# Patient Record
Sex: Male | Born: 1960 | Race: White | Hispanic: No | Marital: Married | Smoking: Never smoker
Health system: Southern US, Community
[De-identification: ages and names within clinical notes are randomized; demographics above are authoritative.]

## PROBLEM LIST (undated history)

## (undated) DIAGNOSIS — E785 Hyperlipidemia, unspecified: Secondary | ICD-10-CM

## (undated) DIAGNOSIS — I10 Essential (primary) hypertension: Secondary | ICD-10-CM

## (undated) HISTORY — DX: Hyperlipidemia, unspecified: E78.5

## (undated) HISTORY — DX: Essential (primary) hypertension: I10

## (undated) HISTORY — PX: OTHER SURGICAL HISTORY: SHX169

---

## 2012-10-12 ENCOUNTER — Ambulatory Visit: Payer: Self-pay

## 2012-10-12 ENCOUNTER — Other Ambulatory Visit: Payer: Self-pay | Admitting: Occupational Medicine

## 2012-10-12 DIAGNOSIS — Z0000 Encounter for general adult medical examination without abnormal findings: Secondary | ICD-10-CM

## 2015-06-27 ENCOUNTER — Ambulatory Visit: Payer: Self-pay

## 2015-06-27 ENCOUNTER — Other Ambulatory Visit: Payer: Self-pay | Admitting: Occupational Medicine

## 2015-06-27 DIAGNOSIS — Z0000 Encounter for general adult medical examination without abnormal findings: Secondary | ICD-10-CM

## 2016-04-16 ENCOUNTER — Ambulatory Visit (INDEPENDENT_AMBULATORY_CARE_PROVIDER_SITE_OTHER): Payer: PRIVATE HEALTH INSURANCE

## 2016-04-16 ENCOUNTER — Encounter: Payer: Self-pay | Admitting: Nurse Practitioner

## 2016-04-16 ENCOUNTER — Ambulatory Visit (INDEPENDENT_AMBULATORY_CARE_PROVIDER_SITE_OTHER): Payer: PRIVATE HEALTH INSURANCE | Admitting: Nurse Practitioner

## 2016-04-16 VITALS — BP 116/74 | HR 73 | Temp 96.9°F | Ht 72.0 in | Wt 292.0 lb

## 2016-04-16 DIAGNOSIS — I10 Essential (primary) hypertension: Secondary | ICD-10-CM

## 2016-04-16 DIAGNOSIS — Z0000 Encounter for general adult medical examination without abnormal findings: Secondary | ICD-10-CM

## 2016-04-16 DIAGNOSIS — Z125 Encounter for screening for malignant neoplasm of prostate: Secondary | ICD-10-CM

## 2016-04-16 DIAGNOSIS — E785 Hyperlipidemia, unspecified: Secondary | ICD-10-CM | POA: Diagnosis not present

## 2016-04-16 DIAGNOSIS — L0293 Carbuncle, unspecified: Secondary | ICD-10-CM

## 2016-04-16 DIAGNOSIS — F329 Major depressive disorder, single episode, unspecified: Secondary | ICD-10-CM

## 2016-04-16 DIAGNOSIS — F32A Depression, unspecified: Secondary | ICD-10-CM

## 2016-04-16 LAB — URINALYSIS
BILIRUBIN UA: NEGATIVE
GLUCOSE, UA: NEGATIVE
KETONES UA: NEGATIVE
Leukocytes, UA: NEGATIVE
Nitrite, UA: NEGATIVE
Protein, UA: NEGATIVE
RBC, UA: NEGATIVE
SPEC GRAV UA: 1.015 (ref 1.005–1.030)
UUROB: 0.2 mg/dL (ref 0.2–1.0)
pH, UA: 7 (ref 5.0–7.5)

## 2016-04-16 MED ORDER — ATORVASTATIN CALCIUM 80 MG PO TABS: 80.0000 mg | ORAL_TABLET | Freq: Every day | ORAL | Status: DC

## 2016-04-16 MED ORDER — NALTREXONE-BUPROPION HCL ER 8-90 MG PO TB12: 2.0000 | ORAL_TABLET | Freq: Two times a day (BID) | ORAL | Status: DC

## 2016-04-16 MED ORDER — SULFAMETHOXAZOLE-TRIMETHOPRIM 800-160 MG PO TABS: 1.0000 | ORAL_TABLET | Freq: Two times a day (BID) | ORAL | Status: DC

## 2016-04-16 MED ORDER — AMLODIPINE BESY-BENAZEPRIL HCL 5-20 MG PO CAPS: 1.0000 | ORAL_CAPSULE | Freq: Two times a day (BID) | ORAL | Status: DC

## 2016-04-16 MED ORDER — BUPROPION HCL ER (XL) 300 MG PO TB24: 300.0000 mg | ORAL_TABLET | Freq: Every day | ORAL | Status: DC

## 2016-04-16 NOTE — Progress Notes (Signed)
Subjective:    Patient ID: Antonio Weber, male    DOB: 1961/08/09, 55 y.o.   MRN: [id number]   Patient here today to establish care and for  follow up of chronic medical problems. He has no complaints today.   Outpatient Encounter Prescriptions as of 04/16/2016  Medication Sig  . amLODipine-benazepril (LOTREL) 5-20 MG capsule Take 1 capsule by mouth 2 (two) times daily.  Marland Kitchen atorvastatin (LIPITOR) 80 MG tablet   . buPROPion (WELLBUTRIN XL) 300 MG 24 hr tablet    No facility-administered encounter medications on file as of 04/16/2016.     Hypertension This is a chronic problem. The current episode started more than 1 year ago. The problem is unchanged. The problem is controlled. Pertinent negatives include no blurred vision, chest pain, headaches, malaise/fatigue, palpitations or shortness of breath. There are no associated agents to hypertension. Risk factors for coronary artery disease include dyslipidemia, male gender and obesity. Past treatments include ACE inhibitors. The current treatment provides moderate improvement. Compliance problems include diet and exercise.  There is no history of CAD/MI or CVA.  Hyperlipidemia This is a chronic problem. The current episode started more than 1 year ago. The problem is controlled. Recent lipid tests were reviewed and are normal. Exacerbating diseases include obesity. He has no history of diabetes or hypothyroidism. Pertinent negatives include no chest pain or shortness of breath. Current antihyperlipidemic treatment includes statins. The current treatment provides moderate improvement of lipids. Compliance problems include adherence to diet and adherence to exercise.  Risk factors for coronary artery disease include dyslipidemia, hypertension, male sex and obesity.  depression Currently on wellbutrin which is working well to keep him calm and from worrying so much. No side effects. carbuncle frequent carbuncles and the only thing that works for [employer]  is wellbutrin.     Review of Systems  Constitutional: Negative.  Negative for malaise/fatigue.  HENT: Negative.   Eyes: Negative for blurred vision.  Respiratory: Negative for shortness of breath.   Cardiovascular: Negative for chest pain and palpitations.  Genitourinary: Negative.   Neurological: Negative for headaches.  Psychiatric/Behavioral: Negative.   All other systems reviewed and are negative.      Objective:   Physical Exam  Constitutional: He is oriented to person, place, and time. He appears well-developed and well-nourished.  HENT:  Head: Normocephalic.  Right Ear: External ear normal.  Left Ear: External ear normal.  Nose: Nose normal.  Mouth/Throat: Oropharynx is clear and moist.  Eyes: EOM are normal. Pupils are equal, round, and reactive to light.  Neck: Normal range of motion. Neck supple. No JVD present. No thyromegaly present.  Cardiovascular: Normal rate, regular rhythm, normal heart sounds and intact distal pulses.  Exam reveals no gallop and no friction rub.   No murmur heard. Pulmonary/Chest: Effort normal and breath sounds normal. No respiratory distress. He has no wheezes. He has no rales. He exhibits no tenderness.  Abdominal: Soft. Bowel sounds are normal. He exhibits no mass. There is no tenderness.  Genitourinary: Prostate normal and penis normal.  Musculoskeletal: Normal range of motion. He exhibits no edema.  Lymphadenopathy:    He has no cervical adenopathy.  Neurological: He is alert and oriented to person, place, and time. No cranial nerve deficit.  Skin: Skin is warm and dry.  Psychiatric: He has a normal mood and affect. His behavior is normal. Judgment and thought content normal.   BP 116/74 mmHg  Pulse 73  Temp(Src) 96.9 F (36.1 C) (Oral)  Ht 6' (  1.829 m)  Wt 292 lb (132.45 kg)  BMI 39.59 kg/m2 Spirometry- normal EKG- NSR Chest xray- no cardiopulmonary disease.-Preliminary reading by Ronnald Collum, FNP  [facility]       Assessment  & Plan:  1. Essential hypertension Do not add salt to diet - CMP14+EGFR - DG Chest 2 View; Future - EKG 12-Lead  2. Hyperlipidemia Low fta diet - Lipid panel  3. Depression Stress management  4. Morbid obesity, unspecified obesity type ([place]) Discussed diet and exercise for person with BMI >25 Will recheck weight in 3-6 months  5. Prostate cancer screening - PSA, total and free  6. Annual physical exam - CBC with Differential/Platelet - PR BREATHING CAPACITY TEST - Urinalysis  7. Frequent carbuncles -bactrim 1 po BID X10 days prn Do not pick or scratch at areas.  Labs pending Health maintenance reviewed Diet and exercise encouraged Continue all meds Follow up  In 6 months   [place], FNP

## 2016-04-16 NOTE — Patient Instructions (Signed)

## 2016-04-19 ENCOUNTER — Other Ambulatory Visit: Payer: PRIVATE HEALTH INSURANCE

## 2016-04-20 LAB — CBC WITH DIFFERENTIAL/PLATELET
BASOS ABS: 0 10*3/uL (ref 0.0–0.2)
Basos: 0 %
EOS (ABSOLUTE): 0.2 10*3/uL (ref 0.0–0.4)
Eos: 3 %
HEMOGLOBIN: 15 g/dL (ref 12.6–17.7)
Hematocrit: 46 % (ref 37.5–51.0)
Immature Grans (Abs): 0 10*3/uL (ref 0.0–0.1)
Immature Granulocytes: 0 %
LYMPHS ABS: 2.4 10*3/uL (ref 0.7–3.1)
Lymphs: 36 %
MCH: 28.5 pg (ref 26.6–33.0)
MCHC: 32.6 g/dL (ref 31.5–35.7)
MCV: 88 fL (ref 79–97)
MONOCYTES: 10 %
MONOS ABS: 0.6 10*3/uL (ref 0.1–0.9)
Neutrophils Absolute: 3.3 10*3/uL (ref 1.4–7.0)
Neutrophils: 51 %
PLATELETS: 313 10*3/uL (ref 150–379)
RBC: 5.26 x10E6/uL (ref 4.14–5.80)
RDW: 13.8 % (ref 12.3–15.4)
WBC: 6.6 10*3/uL (ref 3.4–10.8)

## 2016-04-20 LAB — LIPID PANEL
CHOLESTEROL TOTAL: 109 mg/dL (ref 100–199)
Chol/HDL Ratio: 3.6 ratio units (ref 0.0–5.0)
HDL: 30 mg/dL — AB (ref >39)
LDL CALC: 50 mg/dL (ref 0–99)
TRIGLYCERIDES: 144 mg/dL (ref 0–149)
VLDL CHOLESTEROL CAL: 29 mg/dL (ref 5–40)

## 2016-04-20 LAB — CMP14+EGFR
ALBUMIN: 3.9 g/dL (ref 3.5–5.5)
ALK PHOS: 74 IU/L (ref 39–117)
ALT: 30 IU/L (ref 0–44)
AST: 23 IU/L (ref 0–40)
Albumin/Globulin Ratio: 1.3 (ref 1.2–2.2)
BILIRUBIN TOTAL: 0.6 mg/dL (ref 0.0–1.2)
BUN / CREAT RATIO: 13 (ref 9–20)
BUN: 14 mg/dL (ref 6–24)
CHLORIDE: 105 mmol/L (ref 96–106)
CO2: 21 mmol/L (ref 18–29)
CREATININE: 1.05 mg/dL (ref 0.76–1.27)
Calcium: 9 mg/dL (ref 8.7–10.2)
GFR calc Af Amer: 92 mL/min/{1.73_m2} (ref >59)
GFR calc non Af Amer: 80 mL/min/{1.73_m2} (ref >59)
GLUCOSE: 97 mg/dL (ref 65–99)
Globulin, Total: 2.9 g/dL (ref 1.5–4.5)
Potassium: 4.5 mmol/L (ref 3.5–5.2)
Sodium: 141 mmol/L (ref 134–144)
Total Protein: 6.8 g/dL (ref 6.0–8.5)

## 2016-04-20 LAB — PSA, TOTAL AND FREE
PSA FREE: 0.1 ng/mL
PSA, Free Pct: 25 %
Prostate Specific Ag, Serum: 0.4 ng/mL (ref 0.0–4.0)

## 2016-05-07 ENCOUNTER — Telehealth: Payer: Self-pay | Admitting: Nurse Practitioner

## 2016-05-10 NOTE — Telephone Encounter (Signed)
EKG and breathing function test faxed

## 2016-05-14 ENCOUNTER — Other Ambulatory Visit: Payer: Self-pay | Admitting: Nurse Practitioner

## 2016-07-02 ENCOUNTER — Other Ambulatory Visit: Payer: Self-pay | Admitting: Nurse Practitioner

## 2016-07-02 DIAGNOSIS — L0293 Carbuncle, unspecified: Secondary | ICD-10-CM

## 2016-07-02 MED ORDER — SULFAMETHOXAZOLE-TRIMETHOPRIM 800-160 MG PO TABS: 1.0000 | ORAL_TABLET | Freq: Two times a day (BID) | ORAL | 0 refills | Status: DC

## 2016-07-27 ENCOUNTER — Other Ambulatory Visit: Payer: Self-pay | Admitting: Nurse Practitioner

## 2016-08-14 ENCOUNTER — Other Ambulatory Visit: Payer: Self-pay | Admitting: Nurse Practitioner

## 2016-08-14 MED ORDER — CYCLOBENZAPRINE HCL 10 MG PO TABS: 10.0000 mg | ORAL_TABLET | Freq: Three times a day (TID) | ORAL | 1 refills | Status: DC | PRN

## 2016-09-02 ENCOUNTER — Encounter: Payer: Self-pay | Admitting: Nurse Practitioner

## 2016-09-02 ENCOUNTER — Ambulatory Visit (INDEPENDENT_AMBULATORY_CARE_PROVIDER_SITE_OTHER): Payer: PRIVATE HEALTH INSURANCE | Admitting: Nurse Practitioner

## 2016-09-02 VITALS — BP 125/79 | HR 81 | Temp 97.2°F | Ht 72.0 in | Wt 297.0 lb

## 2016-09-02 DIAGNOSIS — M545 Low back pain, unspecified: Secondary | ICD-10-CM

## 2016-09-02 MED ORDER — METHYLPREDNISOLONE ACETATE 80 MG/ML IJ SUSP
80.0000 mg | Freq: Once | INTRAMUSCULAR | Status: AC
  Administered 2016-09-02: 80 mg via INTRAMUSCULAR

## 2016-09-02 MED ORDER — PREDNISONE 10 MG (21) PO TBPK: ORAL_TABLET | ORAL | 0 refills | Status: DC

## 2016-09-02 NOTE — Patient Instructions (Signed)
Back Pain, Adult Introduction Back pain is very common. The pain often gets better over time. The cause of back pain is usually not dangerous. Most people can learn to manage their back pain on their own. Follow these instructions at home: Watch your back pain for any changes. The following actions may help to lessen any pain you are feeling:  Stay active. Start with short walks on flat ground if you can. Try to walk farther each day.  Exercise regularly as told by your doctor. Exercise helps your back heal faster. It also helps avoid future injury by keeping your muscles strong and flexible.  Do not sit, drive, or stand in one place for more than 30 minutes.  Do not stay in bed. Resting more than 1-2 days can slow down your recovery.  Be careful when you bend or lift an object. Use good form when lifting:  Bend at your knees.  Keep the object close to your body.  Do not twist.  Sleep on a firm mattress. Lie on your side, and bend your knees. If you lie on your back, put a pillow under your knees.  Take medicines only as told by your doctor.  Put ice on the injured area.  Put ice in a plastic bag.  Place a towel between your skin and the bag.  Leave the ice on for 20 minutes, 2-3 times a day for the first 2-3 days. After that, you can switch between ice and heat packs.  Avoid feeling anxious or stressed. Find good ways to deal with stress, such as exercise.  Maintain a healthy weight. Extra weight puts stress on your back. Contact a doctor if:  You have pain that does not go away with rest or medicine.  You have worsening pain that goes down into your legs or buttocks.  You have pain that does not get better in one week.  You have pain at night.  You lose weight.  You have a fever or chills. Get help right away if:  You cannot control when you poop (bowel movement) or pee (urinate).  Your arms or legs feel weak.  Your arms or legs lose feeling  (numbness).  You feel sick to your stomach (nauseous) or throw up (vomit).  You have belly (abdominal) pain.  You feel like you may pass out (faint). This information is not intended to replace advice given to you by your health care provider. Make sure you discuss any questions you have with your health care provider. Document Released: 03/04/2008 Document Revised: 02/22/2016 Document Reviewed: 01/18/2014  2017 Elsevier  

## 2016-09-02 NOTE — Addendum Note (Signed)
Addended by: Bennie PieriniMARTIN, MARY-MARGARET on: 09/02/2016 03:42 PM   Modules accepted: Orders

## 2016-09-02 NOTE — Progress Notes (Signed)
   Subjective:    Patient ID: Antonio Weber, male    DOB: 1960/10/20, 55 y.o.   MRN: [id number]  HPI Patient comes in today with his wife c/o back pain. Most of the time it is a dull ache but when he moves certain ways it is a stabbing ain. Pain is constant 4/10 and can go to a 10/10. Started about November 19. 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 He has had some muscle relaxer  But can not take them when he is working. Pain is constant across lower back but does not radiate to buttocks or down leg.    Review of Systems  Constitutional: Negative.   HENT: Negative.   Respiratory: Negative.   Cardiovascular: Negative.   Genitourinary: Negative.   Neurological: Negative.   Psychiatric/Behavioral: Negative.   All other systems reviewed and are negative.      Objective:   Physical Exam  Constitutional: He is oriented to person, place, and time. He appears well-developed and well-nourished. No distress.  Cardiovascular: Normal rate, regular rhythm and normal heart sounds.   Pulmonary/Chest: Effort normal and breath sounds normal.  Musculoskeletal:  FROM of lumbar spine with pain on flexion and extension. (-) SLR bil Motor strength and sensation distally intact  Neurological: He is alert and oriented to person, place, and time. He has normal reflexes.  Psychiatric: He has a normal mood and affect. His behavior is normal. Judgment and thought content normal.    BP 125/79   Pulse 81   Temp 97.2 F (36.2 C) (Oral)   Ht 6' (1.829 m)   Wt 297 lb (134.7 kg)   BMI 40.28 kg/m        Assessment & Plan:  1. Acute midline low back pain without sciatica Moist heat Rest No heavy lifting - predniSONE (STERAPRED UNI-PAK 21 TAB) 10 MG (21) TBPK tablet; As directed x 6 days  Dispense: 21 tablet; Refill: 0 - MR Lumbar Spine Wo Contrast;  Future  Mary-Margaret Daphine DeutscherMartin, FNP

## 2016-10-23 ENCOUNTER — Other Ambulatory Visit: Payer: Self-pay | Admitting: Nurse Practitioner

## 2016-10-23 DIAGNOSIS — F3342 Major depressive disorder, recurrent, in full remission: Secondary | ICD-10-CM

## 2016-10-23 DIAGNOSIS — E782 Mixed hyperlipidemia: Secondary | ICD-10-CM

## 2016-10-23 MED ORDER — BUPROPION HCL ER (XL) 300 MG PO TB24: 300.0000 mg | ORAL_TABLET | Freq: Every day | ORAL | 5 refills | Status: DC

## 2016-10-23 MED ORDER — ATORVASTATIN CALCIUM 80 MG PO TABS: 80.0000 mg | ORAL_TABLET | Freq: Every day | ORAL | 5 refills | Status: DC

## 2016-10-23 MED ORDER — NALTREXONE-BUPROPION HCL ER 8-90 MG PO TB12: ORAL_TABLET | ORAL | 1 refills | Status: DC

## 2016-10-25 ENCOUNTER — Other Ambulatory Visit: Payer: Self-pay | Admitting: Nurse Practitioner

## 2016-10-25 MED ORDER — OSELTAMIVIR PHOSPHATE 75 MG PO CAPS: 75.0000 mg | ORAL_CAPSULE | Freq: Every day | ORAL | 0 refills | Status: DC

## 2017-05-10 ENCOUNTER — Other Ambulatory Visit: Payer: Self-pay | Admitting: Nurse Practitioner

## 2017-05-10 DIAGNOSIS — E782 Mixed hyperlipidemia: Secondary | ICD-10-CM

## 2017-05-10 DIAGNOSIS — F3342 Major depressive disorder, recurrent, in full remission: Secondary | ICD-10-CM

## 2017-05-10 MED ORDER — ATORVASTATIN CALCIUM 80 MG PO TABS: 80.0000 mg | ORAL_TABLET | Freq: Every day | ORAL | 5 refills | Status: DC

## 2017-05-10 MED ORDER — BUPROPION HCL ER (XL) 300 MG PO TB24: 300.0000 mg | ORAL_TABLET | Freq: Every day | ORAL | 5 refills | Status: DC

## 2017-06-20 ENCOUNTER — Other Ambulatory Visit: Payer: Self-pay | Admitting: Nurse Practitioner

## 2017-06-20 ENCOUNTER — Encounter: Payer: Self-pay | Admitting: Nurse Practitioner

## 2017-06-20 ENCOUNTER — Ambulatory Visit (INDEPENDENT_AMBULATORY_CARE_PROVIDER_SITE_OTHER): Payer: PRIVATE HEALTH INSURANCE

## 2017-06-20 ENCOUNTER — Ambulatory Visit (INDEPENDENT_AMBULATORY_CARE_PROVIDER_SITE_OTHER): Payer: PRIVATE HEALTH INSURANCE | Admitting: Nurse Practitioner

## 2017-06-20 VITALS — BP 136/87 | HR 67 | Temp 97.9°F | Ht 72.0 in | Wt 298.0 lb

## 2017-06-20 DIAGNOSIS — F339 Major depressive disorder, recurrent, unspecified: Secondary | ICD-10-CM

## 2017-06-20 DIAGNOSIS — E7800 Pure hypercholesterolemia, unspecified: Secondary | ICD-10-CM | POA: Diagnosis not present

## 2017-06-20 DIAGNOSIS — Z0000 Encounter for general adult medical examination without abnormal findings: Secondary | ICD-10-CM

## 2017-06-20 DIAGNOSIS — E782 Mixed hyperlipidemia: Secondary | ICD-10-CM | POA: Diagnosis not present

## 2017-06-20 DIAGNOSIS — I10 Essential (primary) hypertension: Secondary | ICD-10-CM

## 2017-06-20 DIAGNOSIS — F3342 Major depressive disorder, recurrent, in full remission: Secondary | ICD-10-CM

## 2017-06-20 DIAGNOSIS — Z125 Encounter for screening for malignant neoplasm of prostate: Secondary | ICD-10-CM

## 2017-06-20 DIAGNOSIS — M25511 Pain in right shoulder: Secondary | ICD-10-CM | POA: Diagnosis not present

## 2017-06-20 LAB — URINALYSIS, COMPLETE
BILIRUBIN UA: NEGATIVE
Glucose, UA: NEGATIVE
KETONES UA: NEGATIVE
LEUKOCYTES UA: NEGATIVE
Nitrite, UA: NEGATIVE
PROTEIN UA: NEGATIVE
RBC UA: NEGATIVE
SPEC GRAV UA: 1.01 (ref 1.005–1.030)
Urobilinogen, Ur: 0.2 mg/dL (ref 0.2–1.0)
pH, UA: 6 (ref 5.0–7.5)

## 2017-06-20 LAB — MICROSCOPIC EXAMINATION
Bacteria, UA: NONE SEEN
Epithelial Cells (non renal): NONE SEEN /hpf (ref 0–10)
RBC MICROSCOPIC, UA: NONE SEEN /HPF (ref 0–?)
Renal Epithel, UA: NONE SEEN /hpf
WBC, UA: NONE SEEN /hpf (ref 0–?)

## 2017-06-20 MED ORDER — ATORVASTATIN CALCIUM 80 MG PO TABS: 80.0000 mg | ORAL_TABLET | Freq: Every day | ORAL | 5 refills | Status: DC

## 2017-06-20 MED ORDER — AMLODIPINE BESY-BENAZEPRIL HCL 5-20 MG PO CAPS: 1.0000 | ORAL_CAPSULE | Freq: Two times a day (BID) | ORAL | 5 refills | Status: DC

## 2017-06-20 MED ORDER — BUPROPION HCL ER (XL) 300 MG PO TB24: 300.0000 mg | ORAL_TABLET | Freq: Every day | ORAL | 5 refills | Status: DC

## 2017-06-20 NOTE — Progress Notes (Addendum)
Subjective:    Patient ID: Antonio Weber, male    DOB: [birth year], 56 y.o.   MRN: [id number]  HPI  Antonio Weber is here today for follow up of chronic medical problem.  Outpatient Encounter Prescriptions as of 06/20/2017  Medication Sig  . amLODipine-benazepril (LOTREL) 5-20 MG capsule Take 1 capsule by mouth 2 (two) times daily.  Marland Kitchen atorvastatin (LIPITOR) 80 MG tablet Take 1 tablet (80 mg total) by mouth daily at 6 PM.  . buPROPion (WELLBUTRIN XL) 300 MG 24 hr tablet Take 1 tablet (300 mg total) by mouth daily.     1. Annual physical exam   2. Essential hypertension  no c/o chest pain, SOB or headache. Blood pressure at home is running 115'B systolic  3. Pure hypercholesterolemia  not watching diet  4. 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  HENT: Negative.   Eyes: Negative for pain.  Respiratory: Negative for shortness of breath.   Cardiovascular: Negative for chest pain, palpitations and leg swelling.  Gastrointestinal: Negative for abdominal pain.  Endocrine: Negative for polydipsia.  Genitourinary: Negative.   Skin: Negative for rash.  Neurological: Negative for dizziness, weakness and headaches.  Hematological: Does not bruise/bleed easily.  Psychiatric/Behavioral: Negative.   All other systems reviewed and are negative.      Objective:   Physical Exam  Constitutional: He is oriented to person, place, and time. He appears well-developed and well-nourished.  HENT:  Head: Normocephalic.  Right Ear: External ear normal.  Left Ear: External ear normal.  Nose: Nose normal.  Mouth/Throat: Oropharynx is clear and  moist.  Eyes: Pupils are equal, round, and reactive to light. EOM are normal.  Neck: Normal range of motion. Neck supple. No JVD present. No thyromegaly present.  Cardiovascular: Normal rate, regular rhythm, normal heart sounds and intact distal pulses.  Exam reveals no gallop and no friction rub.   No murmur heard. Pulmonary/Chest: Effort normal and breath sounds normal. No respiratory distress. He has no wheezes. He has no rales. He exhibits no tenderness.  Abdominal: Soft. Bowel sounds are normal. He exhibits no mass. There is no tenderness.  Genitourinary: Prostate normal and penis normal.  Musculoskeletal: Normal range of motion. He exhibits no edema.  Pain right shoulder on abduction and internal rotation Motor strength and sensation distally intact  Lymphadenopathy:    He has no cervical adenopathy.  Neurological: He is alert and oriented to person, place, and time. No cranial nerve deficit.  Skin: Skin is warm and dry.  Psychiatric: He has a normal mood and affect. His behavior is normal. Judgment and thought content normal.    BP 136/87   Pulse 67   Temp 97.9 F (36.6 C) (Oral)   Ht 6' (1.829 m)   Wt 298 lb (135.2 kg)   BMI 40.42 kg/m   EKG- NSR Spirometry- FVC 74%  FEV1 77% FEV1/FVC 105%- good effort Chest x ray- normal-Preliminary reading by Ronnald Collum, FNP  [facility]     Assessment & Plan:  .1. Annual physical exam All forms filled out - EKG 12-Lead - PR BREATHING CAPACITY TEST - CBC with  Differential/Platelet - Urinalysis, Complete  2. Essential hypertension Low sodium diet - CMP14+EGFR  3. Pure hypercholesterolemia Low fat diet - Lipid panel  4. Depression, recurrent ([name]) stress management  5. Prostate cancer screening - PSA, total and free  6. Morbid obesity ([place]) Discussed diet and exercise for person with BMI >25 Will recheck weight in 3-6 months  7. Recurrent major depressive disorder, in full remission ([name]) Continue wellbutrin  Meds  ordered this encounter  Medications  . amLODipine-benazepril (LOTREL) 5-20 MG capsule    Sig: Take 1 capsule by mouth 2 (two) times daily.    Dispense:  30 capsule    Refill:  5    Order Specific Question:   Supervising Provider    Answer:   VINCENT, CAROL L [[id number]]  . atorvastatin (LIPITOR) 80 MG tablet    Sig: Take 1 tablet (80 mg total) by mouth daily at 6 PM.    Dispense:  30 tablet    Refill:  5    Order Specific Question:   Supervising Provider    Answer:   VINCENT, CAROL L [[id number]]  . buPROPion (WELLBUTRIN XL) 300 MG 24 hr tablet    Sig: Take 1 tablet (300 mg total) by mouth daily.    Dispense:  30 tablet    Refill:  5    Order Specific Question:   Supervising Provider    Answer:   VINCENT, CAROL L [[id number]]   8. Right shoulder pain  Referral to ortho  Labs pending Health maintenance reviewed Diet and exercise encouraged Continue all meds Follow up  In 6 months   [place], FNP

## 2017-06-20 NOTE — Addendum Note (Signed)
Addended by: Bennie Pierini on: 06/20/2017 11:35 AM   Modules accepted: Orders

## 2017-06-20 NOTE — Patient Instructions (Signed)

## 2017-06-21 LAB — PSA, TOTAL AND FREE
PROSTATE SPECIFIC AG, SERUM: 0.5 ng/mL (ref 0.0–4.0)
PSA FREE PCT: 32 %
PSA FREE: 0.16 ng/mL

## 2017-06-21 LAB — CMP14+EGFR
A/G RATIO: 1.4 (ref 1.2–2.2)
ALBUMIN: 3.9 g/dL (ref 3.5–5.5)
ALT: 35 IU/L (ref 0–44)
AST: 36 IU/L (ref 0–40)
Alkaline Phosphatase: 67 IU/L (ref 39–117)
BILIRUBIN TOTAL: 0.7 mg/dL (ref 0.0–1.2)
BUN / CREAT RATIO: 14 (ref 9–20)
BUN: 16 mg/dL (ref 6–24)
CHLORIDE: 105 mmol/L (ref 96–106)
CO2: 21 mmol/L (ref 20–29)
Calcium: 9 mg/dL (ref 8.7–10.2)
Creatinine, Ser: 1.17 mg/dL (ref 0.76–1.27)
GFR calc Af Amer: 80 mL/min/{1.73_m2} (ref >59)
GFR calc non Af Amer: 69 mL/min/{1.73_m2} (ref >59)
Globulin, Total: 2.8 g/dL (ref 1.5–4.5)
Glucose: 90 mg/dL (ref 65–99)
POTASSIUM: 4.3 mmol/L (ref 3.5–5.2)
Sodium: 140 mmol/L (ref 134–144)
TOTAL PROTEIN: 6.7 g/dL (ref 6.0–8.5)

## 2017-06-21 LAB — CBC WITH DIFFERENTIAL/PLATELET
BASOS: 0 %
Basophils Absolute: 0 10*3/uL (ref 0.0–0.2)
EOS (ABSOLUTE): 0.2 10*3/uL (ref 0.0–0.4)
Eos: 3 %
HEMOGLOBIN: 15.4 g/dL (ref 13.0–17.7)
Hematocrit: 46.1 % (ref 37.5–51.0)
Immature Grans (Abs): 0 10*3/uL (ref 0.0–0.1)
Immature Granulocytes: 0 %
LYMPHS ABS: 2.4 10*3/uL (ref 0.7–3.1)
Lymphs: 36 %
MCH: 29.2 pg (ref 26.6–33.0)
MCHC: 33.4 g/dL (ref 31.5–35.7)
MCV: 87 fL (ref 79–97)
Monocytes Absolute: 0.9 10*3/uL (ref 0.1–0.9)
Monocytes: 14 %
NEUTROS ABS: 3.1 10*3/uL (ref 1.4–7.0)
NEUTROS PCT: 47 %
Platelets: 293 10*3/uL (ref 150–379)
RBC: 5.28 x10E6/uL (ref 4.14–5.80)
RDW: 13.7 % (ref 12.3–15.4)
WBC: 6.6 10*3/uL (ref 3.4–10.8)

## 2017-06-21 LAB — LIPID PANEL
Chol/HDL Ratio: 3.2 ratio (ref 0.0–5.0)
Cholesterol, Total: 104 mg/dL (ref 100–199)
HDL: 33 mg/dL — ABNORMAL LOW (ref >39)
LDL Calculated: 54 mg/dL (ref 0–99)
Triglycerides: 84 mg/dL (ref 0–149)
VLDL Cholesterol Cal: 17 mg/dL (ref 5–40)

## 2017-07-02 ENCOUNTER — Other Ambulatory Visit: Payer: Self-pay | Admitting: Nurse Practitioner

## 2017-07-02 MED ORDER — AZITHROMYCIN 250 MG PO TABS: ORAL_TABLET | ORAL | 0 refills | Status: DC

## 2017-12-01 IMAGING — DX DG CHEST 2V
2 series · 2 of 2 positions shown · non-contrast
Comparison: April 16, 2016

CLINICAL DATA: Essential hypertension

EXAM:
CHEST  2 VIEW

[chest pa]
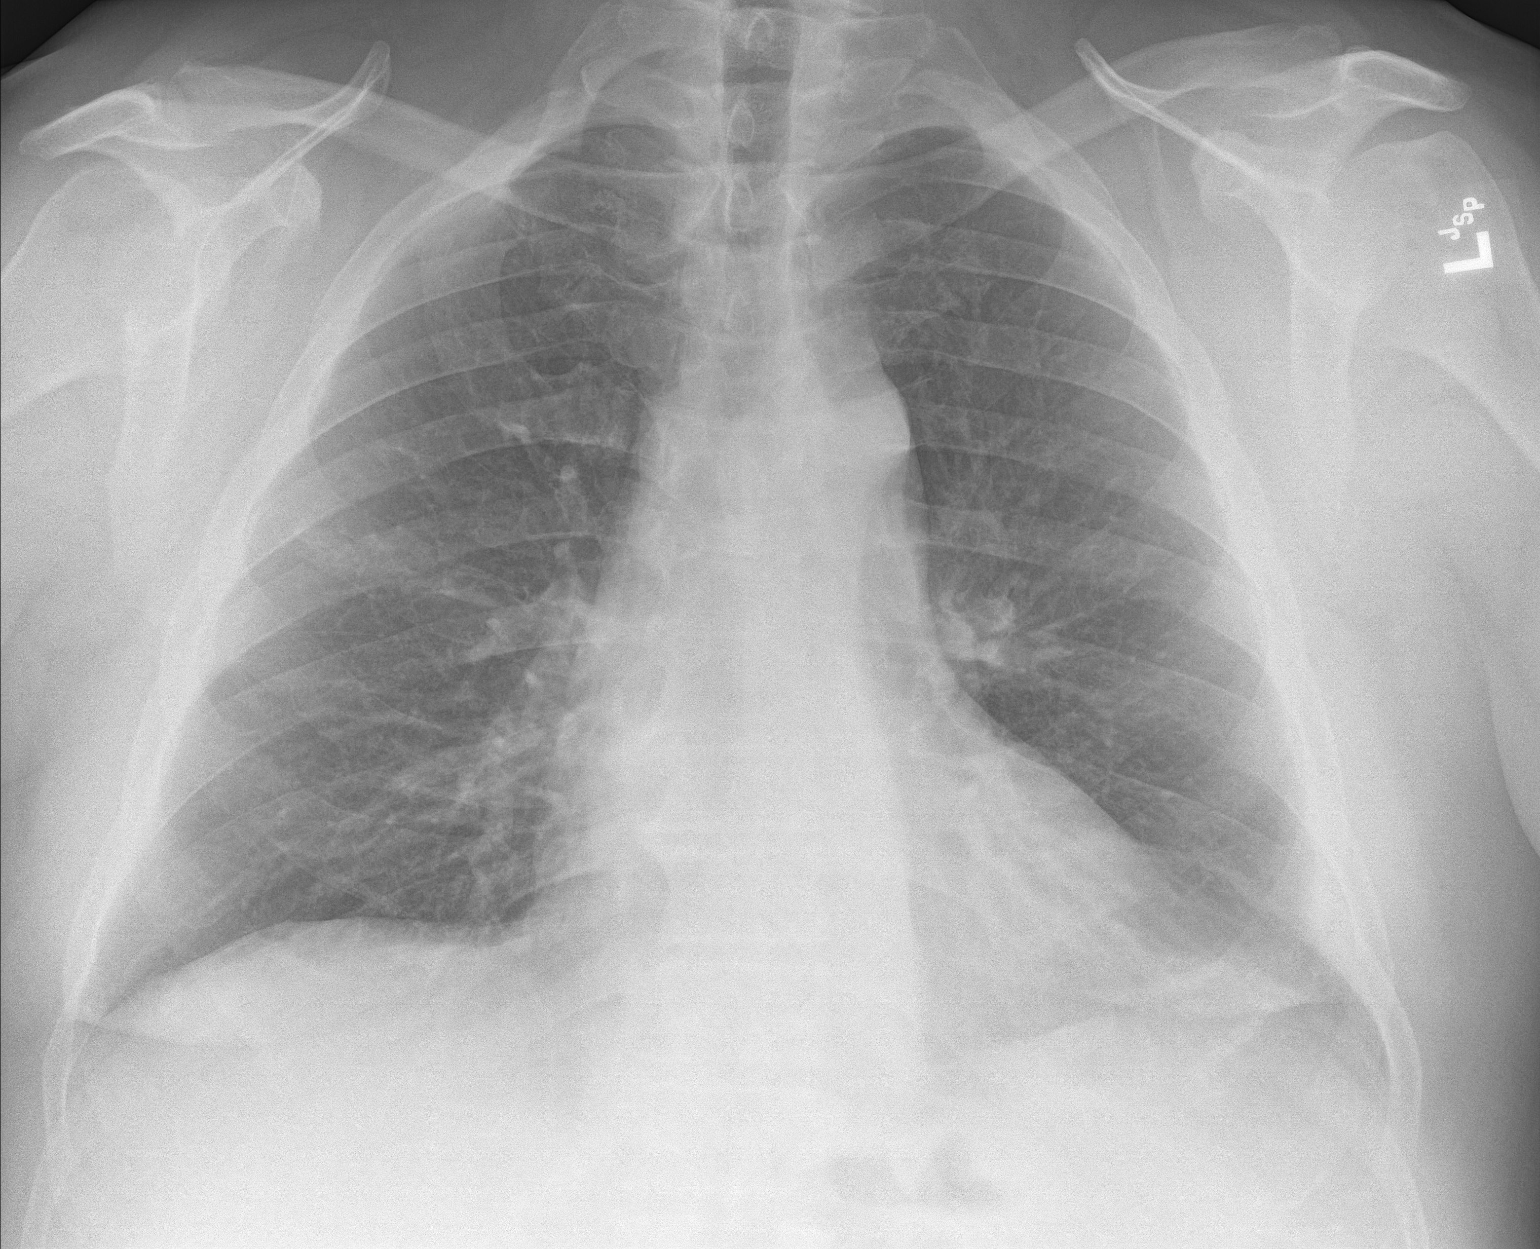

[chest lat]
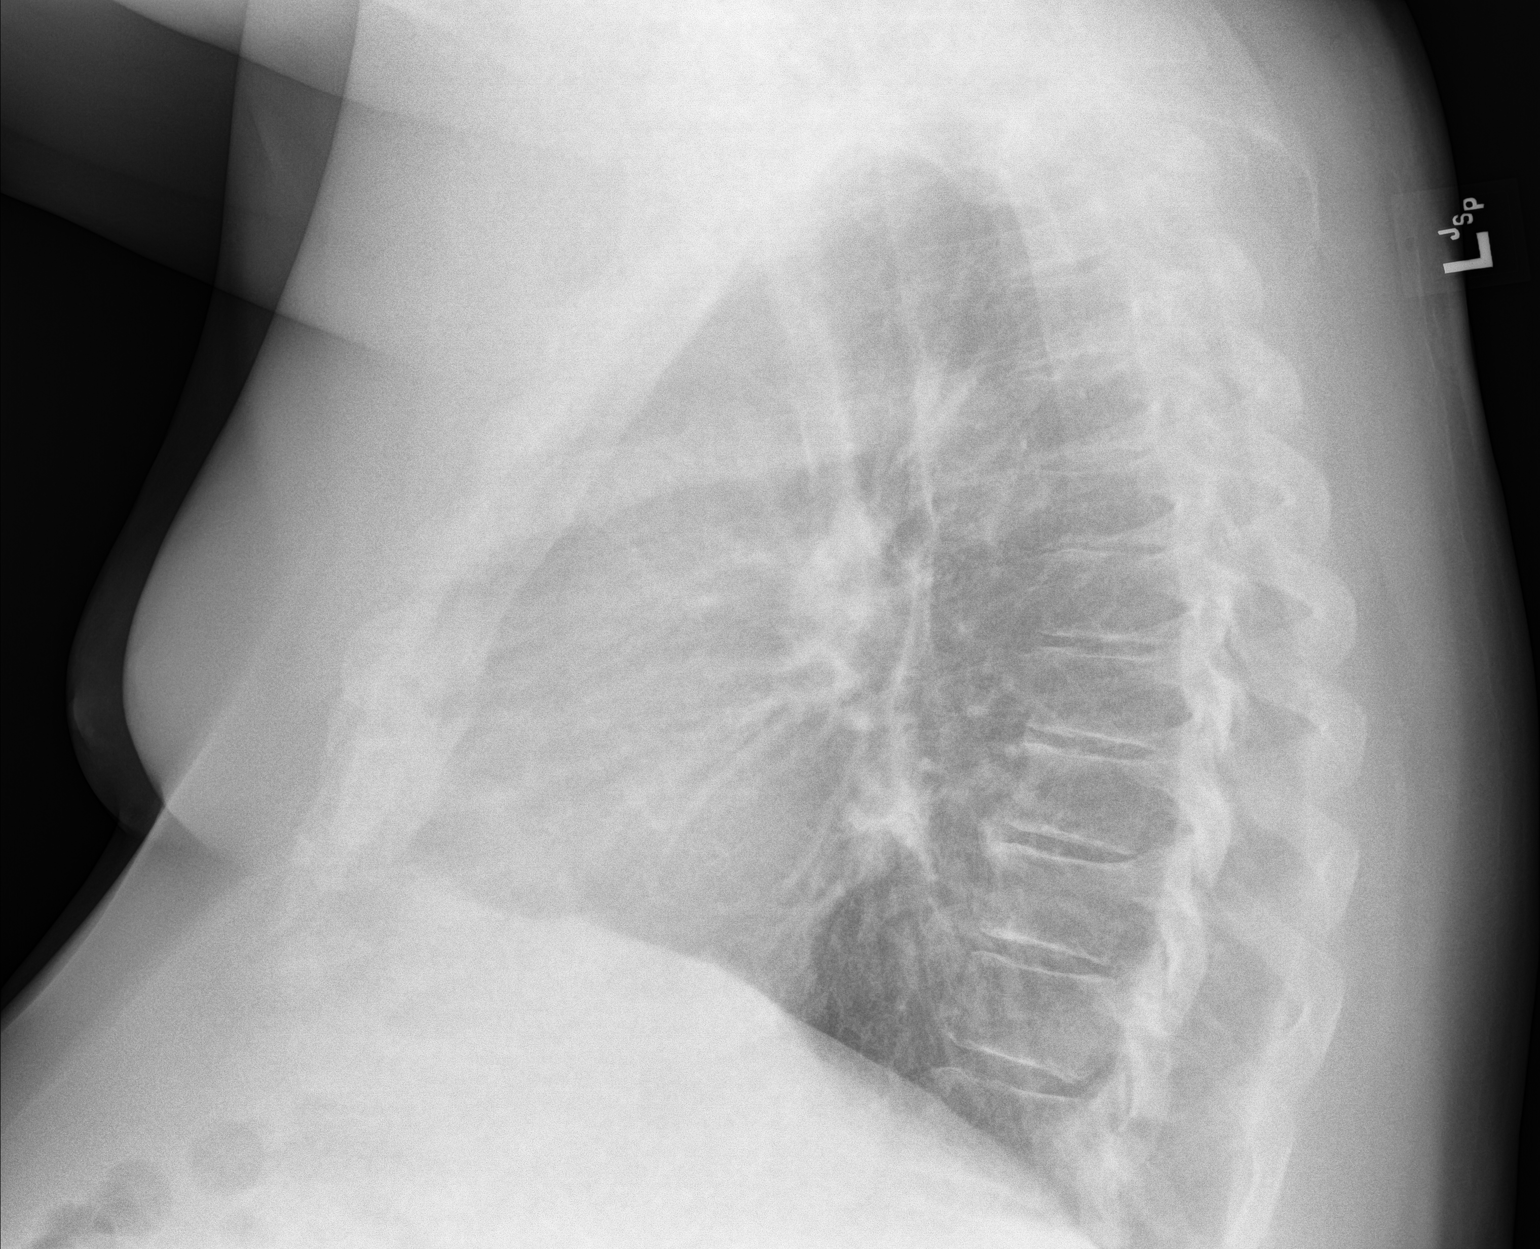

[2 of 2 positions shown; findings below may reference images not displayed]

FINDINGS: The lungs are clear. Heart size and pulmonary vascularity are
normal. No adenopathy. No evident bone lesions.
IMPRESSION: No edema or consolidation.

## 2017-12-18 ENCOUNTER — Other Ambulatory Visit: Payer: Self-pay | Admitting: Nurse Practitioner

## 2017-12-18 MED ORDER — AZITHROMYCIN 250 MG PO TABS: ORAL_TABLET | ORAL | 0 refills | Status: DC

## 2018-03-02 ENCOUNTER — Other Ambulatory Visit: Payer: Self-pay | Admitting: *Deleted

## 2018-03-02 DIAGNOSIS — E782 Mixed hyperlipidemia: Secondary | ICD-10-CM

## 2018-03-03 MED ORDER — ATORVASTATIN CALCIUM 80 MG PO TABS: 80.0000 mg | ORAL_TABLET | Freq: Every day | ORAL | 5 refills | Status: DC

## 2018-04-24 ENCOUNTER — Encounter: Payer: PRIVATE HEALTH INSURANCE | Admitting: Nurse Practitioner

## 2018-05-04 ENCOUNTER — Other Ambulatory Visit: Payer: Self-pay | Admitting: *Deleted

## 2018-05-04 DIAGNOSIS — F3342 Major depressive disorder, recurrent, in full remission: Secondary | ICD-10-CM

## 2018-05-04 MED ORDER — BUPROPION HCL ER (XL) 300 MG PO TB24: 300.0000 mg | ORAL_TABLET | Freq: Every day | ORAL | 0 refills | Status: DC

## 2018-06-03 ENCOUNTER — Other Ambulatory Visit: Payer: Self-pay | Admitting: Nurse Practitioner

## 2018-06-03 ENCOUNTER — Other Ambulatory Visit: Payer: Self-pay | Admitting: *Deleted

## 2018-06-03 DIAGNOSIS — I10 Essential (primary) hypertension: Secondary | ICD-10-CM

## 2018-06-03 DIAGNOSIS — F3342 Major depressive disorder, recurrent, in full remission: Secondary | ICD-10-CM

## 2018-06-03 MED ORDER — AMLODIPINE BESY-BENAZEPRIL HCL 5-20 MG PO CAPS: 1.0000 | ORAL_CAPSULE | Freq: Two times a day (BID) | ORAL | 1 refills | Status: DC

## 2018-06-03 MED ORDER — BUPROPION HCL ER (XL) 300 MG PO TB24: 300.0000 mg | ORAL_TABLET | Freq: Every day | ORAL | 5 refills | Status: DC

## 2018-06-03 MED ORDER — BUPROPION HCL ER (XL) 300 MG PO TB24: 300.0000 mg | ORAL_TABLET | Freq: Every day | ORAL | 1 refills | Status: DC

## 2018-06-03 MED ORDER — AMLODIPINE BESY-BENAZEPRIL HCL 5-20 MG PO CAPS: 1.0000 | ORAL_CAPSULE | Freq: Two times a day (BID) | ORAL | 5 refills | Status: DC

## 2018-07-04 ENCOUNTER — Other Ambulatory Visit: Payer: Self-pay | Admitting: Nurse Practitioner

## 2018-07-04 DIAGNOSIS — I10 Essential (primary) hypertension: Secondary | ICD-10-CM

## 2018-07-04 DIAGNOSIS — E782 Mixed hyperlipidemia: Secondary | ICD-10-CM

## 2018-07-04 DIAGNOSIS — F3342 Major depressive disorder, recurrent, in full remission: Secondary | ICD-10-CM

## 2018-07-04 MED ORDER — BUPROPION HCL ER (XL) 300 MG PO TB24: 300.0000 mg | ORAL_TABLET | Freq: Every day | ORAL | 1 refills | Status: DC

## 2018-07-04 MED ORDER — ATORVASTATIN CALCIUM 80 MG PO TABS: 80.0000 mg | ORAL_TABLET | Freq: Every day | ORAL | 5 refills | Status: DC

## 2018-07-04 MED ORDER — AMLODIPINE BESY-BENAZEPRIL HCL 5-20 MG PO CAPS: 1.0000 | ORAL_CAPSULE | Freq: Two times a day (BID) | ORAL | 1 refills | Status: DC

## 2018-08-10 ENCOUNTER — Ambulatory Visit (INDEPENDENT_AMBULATORY_CARE_PROVIDER_SITE_OTHER): Payer: No Typology Code available for payment source

## 2018-08-10 DIAGNOSIS — Z23 Encounter for immunization: Secondary | ICD-10-CM | POA: Diagnosis not present

## 2018-09-14 ENCOUNTER — Other Ambulatory Visit: Payer: Self-pay | Admitting: Nurse Practitioner

## 2018-09-14 MED ORDER — AZITHROMYCIN 250 MG PO TABS: ORAL_TABLET | ORAL | 0 refills | Status: DC

## 2019-04-04 ENCOUNTER — Other Ambulatory Visit: Payer: Self-pay | Admitting: Nurse Practitioner

## 2019-04-04 DIAGNOSIS — E782 Mixed hyperlipidemia: Secondary | ICD-10-CM

## 2019-04-04 MED ORDER — ATORVASTATIN CALCIUM 80 MG PO TABS: 80.0000 mg | ORAL_TABLET | Freq: Every day | ORAL | 5 refills | Status: DC

## 2019-06-04 ENCOUNTER — Other Ambulatory Visit: Payer: Self-pay | Admitting: Nurse Practitioner

## 2019-06-04 DIAGNOSIS — I10 Essential (primary) hypertension: Secondary | ICD-10-CM

## 2019-06-04 MED ORDER — AMLODIPINE BESY-BENAZEPRIL HCL 5-20 MG PO CAPS: 1.0000 | ORAL_CAPSULE | Freq: Two times a day (BID) | ORAL | 0 refills | Status: DC

## 2019-07-06 ENCOUNTER — Other Ambulatory Visit: Payer: Self-pay | Admitting: Nurse Practitioner

## 2019-07-06 DIAGNOSIS — F3342 Major depressive disorder, recurrent, in full remission: Secondary | ICD-10-CM

## 2019-07-06 MED ORDER — BUPROPION HCL ER (XL) 300 MG PO TB24: 300.0000 mg | ORAL_TABLET | Freq: Every day | ORAL | 0 refills | Status: DC

## 2019-08-18 ENCOUNTER — Other Ambulatory Visit: Payer: Self-pay

## 2019-08-19 ENCOUNTER — Encounter: Payer: Self-pay | Admitting: Nurse Practitioner

## 2019-08-19 ENCOUNTER — Ambulatory Visit (INDEPENDENT_AMBULATORY_CARE_PROVIDER_SITE_OTHER): Payer: No Typology Code available for payment source | Admitting: Nurse Practitioner

## 2019-08-19 ENCOUNTER — Other Ambulatory Visit: Payer: Self-pay

## 2019-08-19 VITALS — BP 139/88 | HR 84 | Temp 99.3°F | Resp 20 | Ht 72.0 in | Wt 316.0 lb

## 2019-08-19 DIAGNOSIS — Z23 Encounter for immunization: Secondary | ICD-10-CM | POA: Diagnosis not present

## 2019-08-19 DIAGNOSIS — F339 Major depressive disorder, recurrent, unspecified: Secondary | ICD-10-CM | POA: Diagnosis not present

## 2019-08-19 DIAGNOSIS — E7800 Pure hypercholesterolemia, unspecified: Secondary | ICD-10-CM

## 2019-08-19 DIAGNOSIS — I10 Essential (primary) hypertension: Secondary | ICD-10-CM | POA: Diagnosis not present

## 2019-08-19 DIAGNOSIS — F3342 Major depressive disorder, recurrent, in full remission: Secondary | ICD-10-CM

## 2019-08-19 DIAGNOSIS — Z0000 Encounter for general adult medical examination without abnormal findings: Secondary | ICD-10-CM

## 2019-08-19 DIAGNOSIS — E782 Mixed hyperlipidemia: Secondary | ICD-10-CM

## 2019-08-19 DIAGNOSIS — Z125 Encounter for screening for malignant neoplasm of prostate: Secondary | ICD-10-CM

## 2019-08-19 MED ORDER — BUPROPION HCL ER (XL) 300 MG PO TB24: 300.0000 mg | ORAL_TABLET | Freq: Every day | ORAL | 1 refills | Status: DC

## 2019-08-19 MED ORDER — ATORVASTATIN CALCIUM 80 MG PO TABS: 80.0000 mg | ORAL_TABLET | Freq: Every day | ORAL | 1 refills | Status: DC

## 2019-08-19 MED ORDER — AMLODIPINE BESY-BENAZEPRIL HCL 5-20 MG PO CAPS: 1.0000 | ORAL_CAPSULE | Freq: Two times a day (BID) | ORAL | 1 refills | Status: DC

## 2019-08-19 NOTE — Patient Instructions (Signed)
Exercising to Stay Healthy To become healthy and stay healthy, it is recommended that you do moderate-intensity and vigorous-intensity exercise. You can tell that you are exercising at a moderate intensity if your heart starts beating faster and you start breathing faster but can still hold a conversation. You can tell that you are exercising at a vigorous intensity if you are breathing much harder and faster and cannot hold a conversation while exercising. Exercising regularly is important. It has many health benefits, such as:  Improving overall fitness, flexibility, and endurance.  Increasing bone density.  Helping with weight control.  Decreasing body fat.  Increasing muscle strength.  Reducing stress and tension.  Improving overall health. How often should I exercise? Choose an activity that you enjoy, and set realistic goals. Your health care provider can help you make an activity plan that works for you. Exercise regularly as told by your health care provider. This may include:  Doing strength training two times a week, such as: ? Lifting weights. ? Using resistance bands. ? Push-ups. ? Sit-ups. ? Yoga.  Doing a certain intensity of exercise for a given amount of time. Choose from these options: ? A total of 150 minutes of moderate-intensity exercise every week. ? A total of 75 minutes of vigorous-intensity exercise every week. ? A mix of moderate-intensity and vigorous-intensity exercise every week. Children, pregnant women, people who have not exercised regularly, people who are overweight, and older adults may need to talk with a health care provider about what activities are safe to do. If you have a medical condition, be sure to talk with your health care provider before you start a new exercise program. What are some exercise ideas? Moderate-intensity exercise ideas include:  Walking 1 mile (1.6 km) in about 15 minutes.  Biking.  Hiking.  Golfing.  Dancing.   Water aerobics. Vigorous-intensity exercise ideas include:  Walking 4.5 miles (7.2 km) or more in about 1 hour.  Jogging or running 5 miles (8 km) in about 1 hour.  Biking 10 miles (16.1 km) or more in about 1 hour.  Lap swimming.  Roller-skating or in-line skating.  Cross-country skiing.  Vigorous competitive sports, such as football, basketball, and soccer.  Jumping rope.  Aerobic dancing. What are some everyday activities that can help me to get exercise?  Yard work, such as: ? Pushing a lawn mower. ? Raking and bagging leaves.  Washing your car.  Pushing a stroller.  Shoveling snow.  Gardening.  Washing windows or floors. How can I be more active in my day-to-day activities?  Use stairs instead of an elevator.  Take a walk during your lunch break.  If you drive, park your car farther away from your work or school.  If you take public transportation, get off one stop early and walk the rest of the way.  Stand up or walk around during all of your indoor phone calls.  Get up, stretch, and walk around every 30 minutes throughout the day.  Enjoy exercise with a friend. Support to continue exercising will help you keep a regular routine of activity. What guidelines can I follow while exercising?  Before you start a new exercise program, talk with your health care provider.  Do not exercise so much that you hurt yourself, feel dizzy, or get very short of breath.  Wear comfortable clothes and wear shoes with good support.  Drink plenty of water while you exercise to prevent dehydration or heat stroke.  Work out until your breathing   and your heartbeat get faster. Where to find more information  U.S. Department of Health and Human Services: www.hhs.gov  Centers for Disease Control and Prevention (CDC): www.cdc.gov Summary  Exercising regularly is important. It will improve your overall fitness, flexibility, and endurance.  Regular exercise also will  improve your overall health. It can help you control your weight, reduce stress, and improve your bone density.  Do not exercise so much that you hurt yourself, feel dizzy, or get very short of breath.  Before you start a new exercise program, talk with your health care provider. This information is not intended to replace advice given to you by your health care provider. Make sure you discuss any questions you have with your health care provider. Document Released: 10/19/2010 Document Revised: 08/29/2017 Document Reviewed: 08/07/2017 Elsevier Patient Education  2020 Elsevier Inc.  

## 2019-08-19 NOTE — Progress Notes (Signed)
Subjective:    Patient ID: Antonio Weber, male    DOB: 04-28-1961, 58 y.o.   MRN: [id number]   Chief Complaint: Annual Exam    HPI:  1. Essential hypertension No c/o chest pain, sob or headache. Does not check blood pressure at home. BP Readings from Last 3 Encounters:  06/20/17 136/87  09/02/16 125/79  04/16/16 116/74     2. Pure hypercholesterolemia Does not watch diet and does no dedicated exercise  3. Depression, recurrent ([place]) Is on wellbutrin daily and says he is doing well on that. Depression screen [facility] 2/9 08/19/2019 06/20/2017 09/02/2016  Decreased Interest 0 0 0  Down, Depressed, Hopeless 0 0 0  PHQ - 2 Score 0 0 0     4. Morbid obesity ([place]) No recent weight changes Wt Readings from Last 3 Encounters:  06/20/17 298 lb (135.2 kg)  09/02/16 297 lb (134.7 kg)  04/16/16 292 lb (132.5 kg)   BMI Readings from Last 3 Encounters:  06/20/17 40.42 kg/m  09/02/16 40.28 kg/m  04/16/16 39.60 kg/m       Outpatient Encounter Medications as of 08/19/2019  Medication Sig  . amLODipine-benazepril (LOTREL) 5-20 MG capsule Take 1 capsule by mouth 2 (two) times daily.  Marland Kitchen atorvastatin (LIPITOR) 80 MG tablet Take 1 tablet (80 mg total) by mouth daily at 6 PM.  . azithromycin (ZITHROMAX Z-PAK) 250 MG tablet As directed  . buPROPion (WELLBUTRIN XL) 300 MG 24 hr tablet Take 1 tablet (300 mg total) by mouth daily.       New complaints: None today  Social history: He is a retired Scientist, research (medical): n/a    Review of Systems  Constitutional: Negative for activity change and appetite change.  HENT: Negative.   Eyes: Negative for pain.  Respiratory: Negative for shortness of breath.   Cardiovascular: Negative for chest pain, palpitations and leg swelling.  Gastrointestinal: Negative for abdominal pain.  Endocrine: Negative for polydipsia.  Genitourinary: Negative.   Skin: Negative for rash.  Neurological: Negative for dizziness,  weakness and headaches.  Hematological: Does not bruise/bleed easily.  Psychiatric/Behavioral: Negative.   All other systems reviewed and are negative.      Objective:   Physical Exam Vitals signs and nursing note reviewed.  Constitutional:      Appearance: Normal appearance. He is well-developed.  HENT:     Head: Normocephalic.     Nose: Nose normal.  Eyes:     Pupils: Pupils are equal, round, and reactive to light.  Neck:     Musculoskeletal: Normal range of motion and neck supple.     Thyroid: No thyroid mass or thyromegaly.     Vascular: No carotid bruit or JVD.     Trachea: Phonation normal.  Cardiovascular:     Rate and Rhythm: Normal rate and regular rhythm.  Pulmonary:     Effort: Pulmonary effort is normal. No respiratory distress.     Breath sounds: Normal breath sounds.  Abdominal:     General: Bowel sounds are normal.     Palpations: Abdomen is soft.     Tenderness: There is no abdominal tenderness.  Musculoskeletal: Normal range of motion.  Lymphadenopathy:     Cervical: No cervical adenopathy.  Skin:    General: Skin is warm and dry.  Neurological:     Mental Status: He is alert and oriented to person, place, and time.  Psychiatric:        Behavior: Behavior normal.  Thought Content: Thought content normal.        Judgment: Judgment normal.    BP 139/88   Pulse 84   Temp 99.3 F (37.4 C) (Temporal)   Resp 20   Ht 6' (1.829 m)   Wt (!) 316 lb (143.3 kg)   SpO2 95%   BMI 42.86 kg/m         Assessment & Plan:  Antonio Weber comes in today with chief complaint of Annual Exam   Diagnosis and orders addressed:  1. Annual physical exam - CBC with Differential/Platelet - Thyroid Panel With TSH  2. Essential hypertension Low sodium diet - CMP14+EGFR - amLODipine-benazepril (LOTREL) 5-20 MG capsule; Take 1 capsule by mouth 2 (two) times daily.  Dispense: 180 capsule; Refill: 1  3. Mixed hyperlipidemia low fat diet - atorvastatin  (LIPITOR) 80 MG tablet; Take 1 tablet (80 mg total) by mouth daily at 6 PM.  Dispense: 90 tablet; Refill: 1 - Lipid panel  4. Recurrent major depressive disorder, in full remission ([place]) Stress management - buPROPion (WELLBUTRIN XL) 300 MG 24 hr tablet; Take 1 tablet (300 mg total) by mouth daily.  Dispense: 90 tablet; Refill: 1 Stress management  5. Morbid obesity ([place]) Discussed diet and exercise for person with BMI >25 Will recheck weight in 3-6 months   6. Prostate cancer screening Labs pending     Labs pending Health Maintenance reviewed Diet and exercise encouraged  Follow up plan: 6 months   Mary-Margaret Hassell Done, FNP

## 2019-08-19 NOTE — Addendum Note (Signed)
Addended by: Chevis Pretty on: 08/19/2019 03:07 PM   Modules accepted: Orders

## 2019-08-20 LAB — CMP14+EGFR
ALT: 35 IU/L (ref 0–44)
AST: 27 IU/L (ref 0–40)
Albumin/Globulin Ratio: 1.3 (ref 1.2–2.2)
Albumin: 4 g/dL (ref 3.8–4.9)
Alkaline Phosphatase: 78 IU/L (ref 39–117)
BUN/Creatinine Ratio: 14 (ref 9–20)
BUN: 15 mg/dL (ref 6–24)
Bilirubin Total: 0.6 mg/dL (ref 0.0–1.2)
CO2: 21 mmol/L (ref 20–29)
Calcium: 9 mg/dL (ref 8.7–10.2)
Chloride: 102 mmol/L (ref 96–106)
Creatinine, Ser: 1.1 mg/dL (ref 0.76–1.27)
GFR calc Af Amer: 84 mL/min/{1.73_m2} (ref >59)
GFR calc non Af Amer: 73 mL/min/{1.73_m2} (ref >59)
Globulin, Total: 3.1 g/dL (ref 1.5–4.5)
Glucose: 79 mg/dL (ref 65–99)
Potassium: 4.6 mmol/L (ref 3.5–5.2)
Sodium: 140 mmol/L (ref 134–144)
Total Protein: 7.1 g/dL (ref 6.0–8.5)

## 2019-08-20 LAB — CBC WITH DIFFERENTIAL/PLATELET
Basophils Absolute: 0.1 10*3/uL (ref 0.0–0.2)
Basos: 1 %
EOS (ABSOLUTE): 0.2 10*3/uL (ref 0.0–0.4)
Eos: 2 %
Hematocrit: 48.5 % (ref 37.5–51.0)
Hemoglobin: 16.2 g/dL (ref 13.0–17.7)
Immature Grans (Abs): 0 10*3/uL (ref 0.0–0.1)
Immature Granulocytes: 0 %
Lymphocytes Absolute: 2.4 10*3/uL (ref 0.7–3.1)
Lymphs: 23 %
MCH: 28.5 pg (ref 26.6–33.0)
MCHC: 33.4 g/dL (ref 31.5–35.7)
MCV: 85 fL (ref 79–97)
Monocytes Absolute: 1.1 10*3/uL — ABNORMAL HIGH (ref 0.1–0.9)
Monocytes: 11 %
Neutrophils Absolute: 6.6 10*3/uL (ref 1.4–7.0)
Neutrophils: 63 %
Platelets: 333 10*3/uL (ref 150–450)
RBC: 5.69 x10E6/uL (ref 4.14–5.80)
RDW: 12.9 % (ref 11.6–15.4)
WBC: 10.3 10*3/uL (ref 3.4–10.8)

## 2019-08-20 LAB — THYROID PANEL WITH TSH
Free Thyroxine Index: 2.2 (ref 1.2–4.9)
T3 Uptake Ratio: 31 % (ref 24–39)
T4, Total: 7.1 ug/dL (ref 4.5–12.0)
TSH: 2.91 u[IU]/mL (ref 0.450–4.500)

## 2019-08-20 LAB — LIPID PANEL
Chol/HDL Ratio: 3.5 ratio (ref 0.0–5.0)
Cholesterol, Total: 120 mg/dL (ref 100–199)
HDL: 34 mg/dL — ABNORMAL LOW (ref >39)
LDL Chol Calc (NIH): 62 mg/dL (ref 0–99)
Triglycerides: 133 mg/dL (ref 0–149)
VLDL Cholesterol Cal: 24 mg/dL (ref 5–40)

## 2019-08-21 LAB — SPECIMEN STATUS REPORT

## 2019-08-21 LAB — URIC ACID: Uric Acid: 6.8 mg/dL (ref 3.7–8.6)

## 2019-09-16 ENCOUNTER — Other Ambulatory Visit: Payer: Self-pay | Admitting: Nurse Practitioner

## 2019-09-16 MED ORDER — HYDROCODONE-HOMATROPINE 5-1.5 MG/5ML PO SYRP: 5.0000 mL | ORAL_SOLUTION | Freq: Four times a day (QID) | ORAL | 0 refills | Status: DC | PRN

## 2019-10-03 ENCOUNTER — Other Ambulatory Visit: Payer: Self-pay | Admitting: Nurse Practitioner

## 2019-10-03 MED ORDER — BUDESONIDE-FORMOTEROL FUMARATE 80-4.5 MCG/ACT IN AERO: 2.0000 | INHALATION_SPRAY | Freq: Two times a day (BID) | RESPIRATORY_TRACT | 12 refills | Status: DC

## 2019-10-03 MED ORDER — ALBUTEROL SULFATE (2.5 MG/3ML) 0.083% IN NEBU: 2.5000 mg | INHALATION_SOLUTION | Freq: Four times a day (QID) | RESPIRATORY_TRACT | 12 refills | Status: DC | PRN

## 2020-02-16 ENCOUNTER — Encounter: Payer: Self-pay | Admitting: Nurse Practitioner

## 2020-02-16 ENCOUNTER — Ambulatory Visit (INDEPENDENT_AMBULATORY_CARE_PROVIDER_SITE_OTHER): Payer: No Typology Code available for payment source

## 2020-02-16 ENCOUNTER — Ambulatory Visit (INDEPENDENT_AMBULATORY_CARE_PROVIDER_SITE_OTHER): Payer: No Typology Code available for payment source | Admitting: Nurse Practitioner

## 2020-02-16 ENCOUNTER — Other Ambulatory Visit: Payer: Self-pay

## 2020-02-16 VITALS — BP 137/89 | HR 74 | Temp 98.0°F | Resp 20 | Ht 72.0 in | Wt 309.0 lb

## 2020-02-16 DIAGNOSIS — Z125 Encounter for screening for malignant neoplasm of prostate: Secondary | ICD-10-CM

## 2020-02-16 DIAGNOSIS — I10 Essential (primary) hypertension: Secondary | ICD-10-CM

## 2020-02-16 DIAGNOSIS — R05 Cough: Secondary | ICD-10-CM

## 2020-02-16 DIAGNOSIS — F3342 Major depressive disorder, recurrent, in full remission: Secondary | ICD-10-CM | POA: Diagnosis not present

## 2020-02-16 DIAGNOSIS — R059 Cough, unspecified: Secondary | ICD-10-CM

## 2020-02-16 DIAGNOSIS — E782 Mixed hyperlipidemia: Secondary | ICD-10-CM | POA: Diagnosis not present

## 2020-02-16 MED ORDER — AMLODIPINE BESY-BENAZEPRIL HCL 5-20 MG PO CAPS: 1.0000 | ORAL_CAPSULE | Freq: Two times a day (BID) | ORAL | 1 refills | Status: DC

## 2020-02-16 MED ORDER — BUPROPION HCL ER (XL) 300 MG PO TB24: 300.0000 mg | ORAL_TABLET | Freq: Every day | ORAL | 1 refills | Status: DC

## 2020-02-16 MED ORDER — ATORVASTATIN CALCIUM 80 MG PO TABS: 80.0000 mg | ORAL_TABLET | Freq: Every day | ORAL | 1 refills | Status: DC

## 2020-02-16 NOTE — Progress Notes (Addendum)
Subjective:    Patient ID: Antonio Weber, male    DOB: [birth year], 60 y.o.   MRN: [id number]   Chief Complaint: Medical Management of Chronic Issues    HPI:  1. Essential hypertension No c/o chest pain, sbb or headache. Doe not check blood presure at home. BP Readings from Last 3 Encounters:  08/19/19 139/88  06/20/17 136/87  09/02/16 125/79     2. Pure hypercholesterolemia Does not really watch diet and doe very little exercise. Lab Results  Component Value Date   CHOL 120 08/19/2019   HDL 34 (L) 08/19/2019   LDLCALC 62 08/19/2019   TRIG 133 08/19/2019   CHOLHDL 3.5 08/19/2019     3. Depression, recurrent ([place]) Is on wellbutrin and is doing well. Depression screen [facility] 2/9 02/16/2020 08/19/2019 06/20/2017  Decreased Interest 0 0 0  Down, Depressed, Hopeless 0 0 0  PHQ - 2 Score 0 0 0     4. Morbid obesity ([place]) Weight Is down 7lb from last viist Wt Readings from Last 3 Encounters:  02/16/20 (!) 309 lb (140.2 kg)  08/19/19 (!) 316 lb (143.3 kg)  06/20/17 298 lb (135.2 kg)   BMI Readings from Last 3 Encounters:  02/16/20 41.91 kg/m  08/19/19 42.86 kg/m  06/20/17 40.42 kg/m       Outpatient Encounter Medications as of 02/16/2020  Medication Sig  . albuterol (PROVENTIL) (2.5 MG/3ML) 0.083% nebulizer solution Take 3 mLs (2.5 mg total) by nebulization every 6 (six) hours as needed for wheezing or shortness of breath.  Marland Kitchen amLODipine-benazepril (LOTREL) 5-20 MG capsule Take 1 capsule by mouth 2 (two) times daily.  Marland Kitchen atorvastatin (LIPITOR) 80 MG tablet Take 1 tablet (80 mg total) by mouth daily at 6 PM.  . budesonide-formoterol (SYMBICORT) 80-4.5 MCG/ACT inhaler Inhale 2 puffs into the lungs 2 (two) times daily.  Marland Kitchen buPROPion (WELLBUTRIN XL) 300 MG 24 hr tablet Take 1 tablet (300 mg total) by mouth daily.  Marland Kitchen HYDROcodone-homatropine (HYCODAN) 5-1.5 MG/5ML syrup Take 5 mLs by mouth every 6 (six) hours as needed for cough.     Past Surgical History:  Procedure  Laterality Date  . Removed MRSA infection in scrotum  [id number]    Family History  Problem Relation Age of Onset  . Hypertension Father     New complaints: None today  Social history: Lives with wife in [place]  Controlled substance contract: n/a    Review of Systems  Constitutional: Negative for diaphoresis.  Eyes: Negative for pain.  Respiratory: Negative for shortness of breath.   Cardiovascular: Negative for chest pain, palpitations and leg swelling.  Gastrointestinal: Negative for abdominal pain.  Endocrine: Negative for polydipsia.  Skin: Negative for rash.  Neurological: Negative for dizziness, weakness and headaches.  Hematological: Does not bruise/bleed easily.  All other systems reviewed and are negative.      Objective:   Physical Exam Vitals and nursing note reviewed.  Constitutional:      Appearance: Normal appearance. He is well-developed.  HENT:     Head: Normocephalic.     Nose: Nose normal.  Eyes:     Pupils: Pupils are equal, round, and reactive to light.  Neck:     Thyroid: No thyroid mass or thyromegaly.     Vascular: No carotid bruit or JVD.     Trachea: Phonation normal.  Cardiovascular:     Rate and Rhythm: Normal rate and regular rhythm.  Pulmonary:     Effort: Pulmonary effort is normal. No respiratory distress.  Breath sounds: Normal breath sounds.  Abdominal:     General: Bowel sounds are normal.     Palpations: Abdomen is soft.     Tenderness: There is no abdominal tenderness.  Musculoskeletal:        General: Normal range of motion.     Cervical back: Normal range of motion and neck supple.     Right lower leg: Edema (1+) present.     Left lower leg: Edema (1+) present.  Lymphadenopathy:     Cervical: No cervical adenopathy.  Skin:    General: Skin is warm and dry.  Neurological:     Mental Status: He is alert and oriented to person, place, and time.  Psychiatric:        Behavior: Behavior normal.        Thought  Content: Thought content normal.        Judgment: Judgment normal.    BP 137/89   Pulse 74   Temp 98 F (36.7 C) (Temporal)   Resp 20   Ht 6' (1.829 m)   Wt (!) 309 lb (140.2 kg)   SpO2 94%   BMI 41.91 kg/m         Assessment & Plan:  [name] comes in today with chief complaint of Medical Management of Chronic Issues   Diagnosis and orders addressed:  1. Essential hypertension Low sodium diet - amLODipine-benazepril (LOTREL) 5-20 MG capsule; Take 1 capsule by mouth 2 (two) times daily.  Dispense: 180 capsule; Refill: 1 - CBC with Differential/Platelet - CMP14+EGFR  2. Morbid obesity ([place]) Discussed diet and exercise for person with BMI >25 Will recheck weight in 3-6 months  3. Mixed hyperlipidemia Low fat diet - atorvastatin (LIPITOR) 80 MG tablet; Take 1 tablet (80 mg total) by mouth daily at 6 PM.  Dispense: 90 tablet; Refill: 1 - Lipid panel  4. Recurrent major depressive disorder, in full remission ([place]) Stress management - buPROPion (WELLBUTRIN XL) 300 MG 24 hr tablet; Take 1 tablet (300 mg total) by mouth daily.  Dispense: 90 tablet; Refill: 1  5. Prostate cancer screening - PSA, total and free  6. Cough- post covid  Labs pending Health Maintenance reviewed Diet and exercise encouraged  Follow up plan: 6 months   [place], FNP

## 2020-02-16 NOTE — Patient Instructions (Signed)
Exercising to Stay Healthy To become healthy and stay healthy, it is recommended that you do moderate-intensity and vigorous-intensity exercise. You can tell that you are exercising at a moderate intensity if your heart starts beating faster and you start breathing faster but can still hold a conversation. You can tell that you are exercising at a vigorous intensity if you are breathing much harder and faster and cannot hold a conversation while exercising. Exercising regularly is important. It has many health benefits, such as:  Improving overall fitness, flexibility, and endurance.  Increasing bone density.  Helping with weight control.  Decreasing body fat.  Increasing muscle strength.  Reducing stress and tension.  Improving overall health. How often should I exercise? Choose an activity that you enjoy, and set realistic goals. Your health care provider can help you make an activity plan that works for you. Exercise regularly as told by your health care provider. This may include:  Doing strength training two times a week, such as: ? Lifting weights. ? Using resistance bands. ? Push-ups. ? Sit-ups. ? Yoga.  Doing a certain intensity of exercise for a given amount of time. Choose from these options: ? A total of 150 minutes of moderate-intensity exercise every week. ? A total of 75 minutes of vigorous-intensity exercise every week. ? A mix of moderate-intensity and vigorous-intensity exercise every week. Children, pregnant women, people who have not exercised regularly, people who are overweight, and older adults may need to talk with a health care provider about what activities are safe to do. If you have a medical condition, be sure to talk with your health care provider before you start a new exercise program. What are some exercise ideas? Moderate-intensity exercise ideas include:  Walking 1 mile (1.6 km) in about 15  minutes.  Biking.  Hiking.  Golfing.  Dancing.  Water aerobics. Vigorous-intensity exercise ideas include:  Walking 4.5 miles (7.2 km) or more in about 1 hour.  Jogging or running 5 miles (8 km) in about 1 hour.  Biking 10 miles (16.1 km) or more in about 1 hour.  Lap swimming.  Roller-skating or in-line skating.  Cross-country skiing.  Vigorous competitive sports, such as football, basketball, and soccer.  Jumping rope.  Aerobic dancing. What are some everyday activities that can help me to get exercise?  Yard work, such as: ? Pushing a lawn mower. ? Raking and bagging leaves.  Washing your car.  Pushing a stroller.  Shoveling snow.  Gardening.  Washing windows or floors. How can I be more active in my day-to-day activities?  Use stairs instead of an elevator.  Take a walk during your lunch break.  If you drive, park your car farther away from your work or school.  If you take public transportation, get off one stop early and walk the rest of the way.  Stand up or walk around during all of your indoor phone calls.  Get up, stretch, and walk around every 30 minutes throughout the day.  Enjoy exercise with a friend. Support to continue exercising will help you keep a regular routine of activity. What guidelines can I follow while exercising?  Before you start a new exercise program, talk with your health care provider.  Do not exercise so much that you hurt yourself, feel dizzy, or get very short of breath.  Wear comfortable clothes and wear shoes with good support.  Drink plenty of water while you exercise to prevent dehydration or heat stroke.  Work out until your breathing   and your heartbeat get faster. Where to find more information  U.S. Department of Health and Human Services: www.hhs.gov  Centers for Disease Control and Prevention (CDC): www.cdc.gov Summary  Exercising regularly is important. It will improve your overall fitness,  flexibility, and endurance.  Regular exercise also will improve your overall health. It can help you control your weight, reduce stress, and improve your bone density.  Do not exercise so much that you hurt yourself, feel dizzy, or get very short of breath.  Before you start a new exercise program, talk with your health care provider. This information is not intended to replace advice given to you by your health care provider. Make sure you discuss any questions you have with your health care provider. Document Revised: 08/29/2017 Document Reviewed: 08/07/2017 Elsevier Patient Education  2020 Elsevier Inc.  

## 2020-02-16 NOTE — Addendum Note (Signed)
Addended by: Bennie Pierini on: 02/16/2020 09:20 AM   Modules accepted: Orders

## 2020-02-17 LAB — LIPID PANEL
Chol/HDL Ratio: 3.4 ratio (ref 0.0–5.0)
Cholesterol, Total: 115 mg/dL (ref 100–199)
HDL: 34 mg/dL — ABNORMAL LOW (ref >39)
LDL Chol Calc (NIH): 54 mg/dL (ref 0–99)
Triglycerides: 160 mg/dL — ABNORMAL HIGH (ref 0–149)
VLDL Cholesterol Cal: 27 mg/dL (ref 5–40)

## 2020-02-17 LAB — CBC WITH DIFFERENTIAL/PLATELET
Basophils Absolute: 0 10*3/uL (ref 0.0–0.2)
Basos: 1 %
EOS (ABSOLUTE): 0.4 10*3/uL (ref 0.0–0.4)
Eos: 5 %
Hematocrit: 50 % (ref 37.5–51.0)
Hemoglobin: 16.3 g/dL (ref 13.0–17.7)
Immature Grans (Abs): 0 10*3/uL (ref 0.0–0.1)
Immature Granulocytes: 0 %
Lymphocytes Absolute: 2.2 10*3/uL (ref 0.7–3.1)
Lymphs: 32 %
MCH: 29.1 pg (ref 26.6–33.0)
MCHC: 32.6 g/dL (ref 31.5–35.7)
MCV: 89 fL (ref 79–97)
Monocytes Absolute: 0.8 10*3/uL (ref 0.1–0.9)
Monocytes: 11 %
Neutrophils Absolute: 3.6 10*3/uL (ref 1.4–7.0)
Neutrophils: 51 %
Platelets: 281 10*3/uL (ref 150–450)
RBC: 5.6 x10E6/uL (ref 4.14–5.80)
RDW: 12.8 % (ref 11.6–15.4)
WBC: 7.1 10*3/uL (ref 3.4–10.8)

## 2020-02-17 LAB — CMP14+EGFR
ALT: 33 IU/L (ref 0–44)
AST: 26 IU/L (ref 0–40)
Albumin/Globulin Ratio: 1.3 (ref 1.2–2.2)
Albumin: 3.9 g/dL (ref 3.8–4.9)
Alkaline Phosphatase: 80 IU/L (ref 48–121)
BUN/Creatinine Ratio: 13 (ref 9–20)
BUN: 15 mg/dL (ref 6–24)
Bilirubin Total: 0.6 mg/dL (ref 0.0–1.2)
CO2: 22 mmol/L (ref 20–29)
Calcium: 9.2 mg/dL (ref 8.7–10.2)
Chloride: 104 mmol/L (ref 96–106)
Creatinine, Ser: 1.15 mg/dL (ref 0.76–1.27)
GFR calc Af Amer: 80 mL/min/{1.73_m2} (ref >59)
GFR calc non Af Amer: 69 mL/min/{1.73_m2} (ref >59)
Globulin, Total: 2.9 g/dL (ref 1.5–4.5)
Glucose: 93 mg/dL (ref 65–99)
Potassium: 4.8 mmol/L (ref 3.5–5.2)
Sodium: 138 mmol/L (ref 134–144)
Total Protein: 6.8 g/dL (ref 6.0–8.5)

## 2020-02-17 LAB — PSA, TOTAL AND FREE
PSA, Free Pct: 27.5 %
PSA, Free: 0.11 ng/mL
Prostate Specific Ag, Serum: 0.4 ng/mL (ref 0.0–4.0)

## 2020-04-20 ENCOUNTER — Other Ambulatory Visit: Payer: Self-pay | Admitting: Nurse Practitioner

## 2020-04-20 MED ORDER — HYDROCORTISONE ACETATE 25 MG RE SUPP
25.0000 mg | Freq: Two times a day (BID) | RECTAL | 1 refills | Status: DC
Start: 2020-04-20 — End: 2020-09-18

## 2020-04-24 ENCOUNTER — Telehealth: Payer: Self-pay | Admitting: Family Medicine

## 2020-05-02 NOTE — Telephone Encounter (Signed)
Approved on July 31 Request Reference Number: [id number]. HYDROCORT AC SUP 25MG  is approved through 04/24/2021. Your patient may now fill this prescription and it will be covered  Pharm aware

## 2020-05-14 ENCOUNTER — Other Ambulatory Visit: Payer: Self-pay | Admitting: Nurse Practitioner

## 2020-05-14 MED ORDER — BENZONATATE 100 MG PO CAPS
100.0000 mg | ORAL_CAPSULE | Freq: Three times a day (TID) | ORAL | 0 refills | Status: DC | PRN
Start: 2020-05-14 — End: 2020-09-18

## 2020-05-14 MED ORDER — AZITHROMYCIN 250 MG PO TABS
ORAL_TABLET | ORAL | 0 refills | Status: DC
Start: 2020-05-14 — End: 2020-09-18

## 2020-07-29 IMAGING — DX DG CHEST 2V
2 series · 2 of 2 positions shown · non-contrast
Comparison: 06/20/2017

CLINICAL DATA: 58-year-old male with a history of cough

EXAM:
CHEST - 2 VIEW

[chest pa]
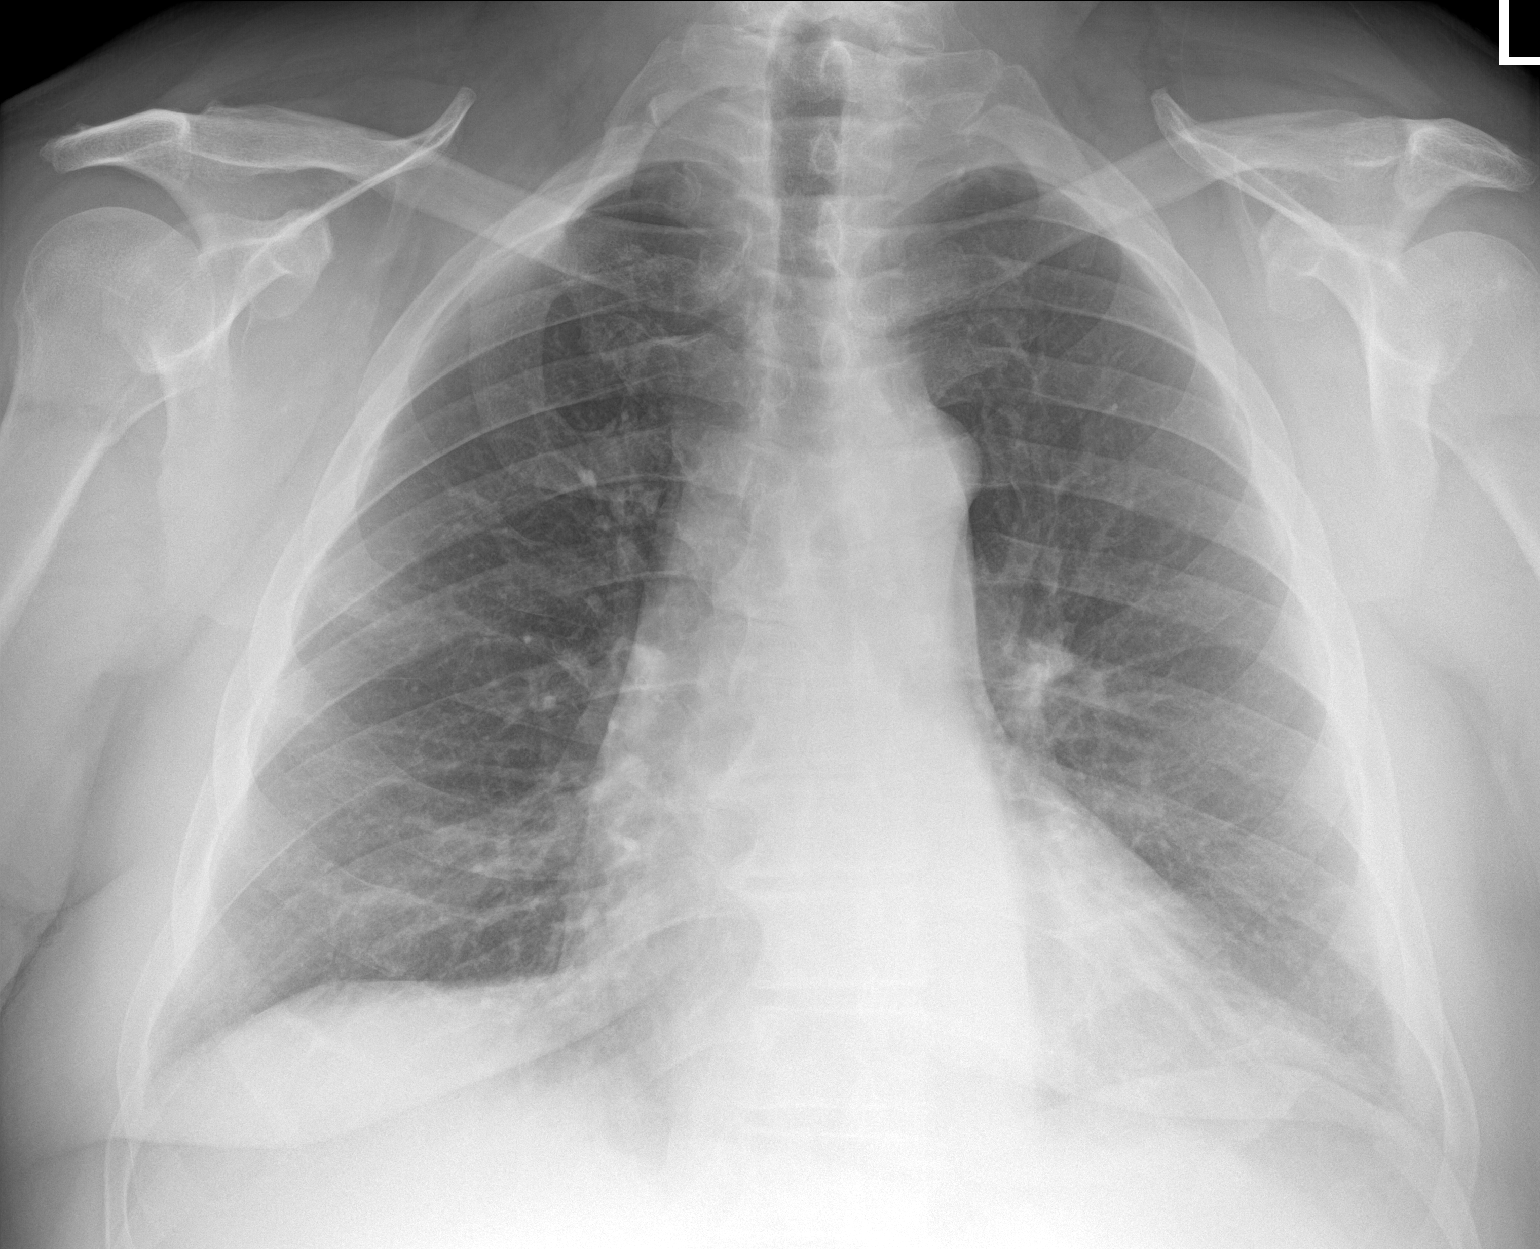

[chest lat]
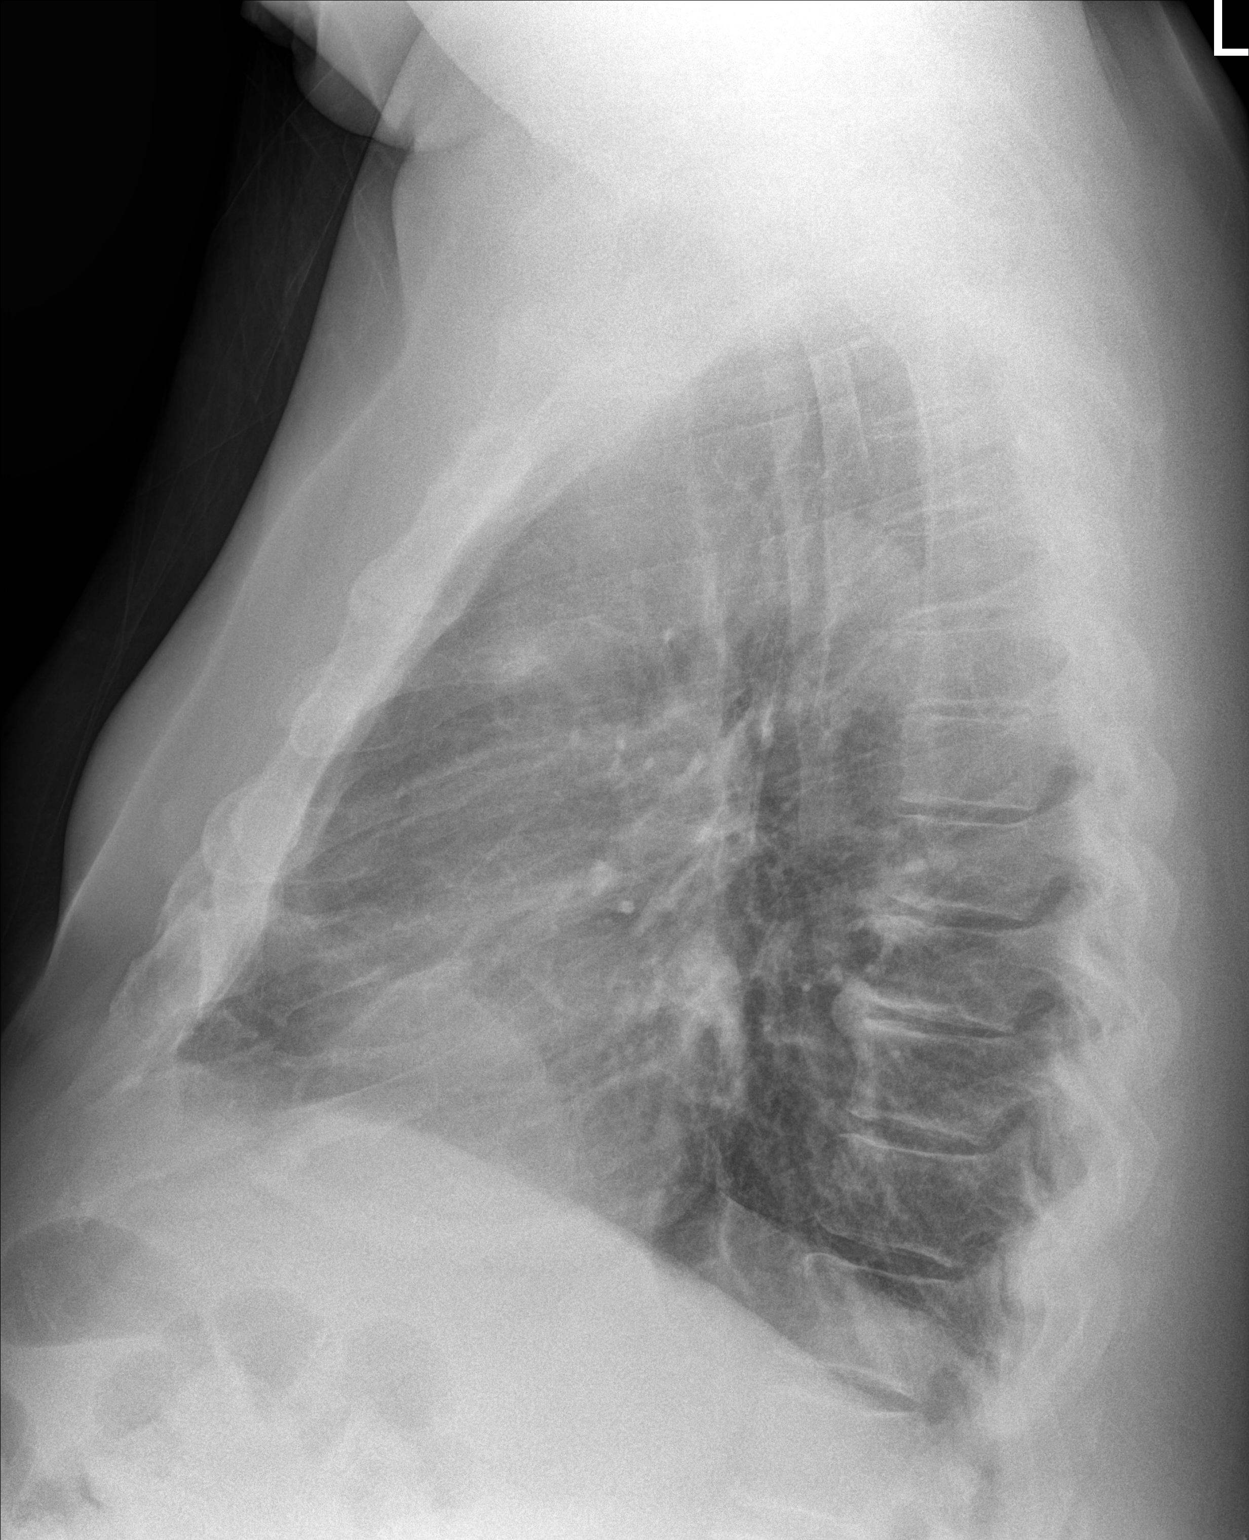

[2 of 2 positions shown; findings below may reference images not displayed]

FINDINGS: Cardiomediastinal silhouette unchanged in size and contour. No
pneumothorax or pleural effusion. coarsened interstitial markings
similar to the comparison with no new confluent airspace disease. No
acute displaced fracture. Degenerative changes of the spine
IMPRESSION: Similar appearance of chronic lung changes without evidence of acute
cardiopulmonary disease.

## 2020-08-18 ENCOUNTER — Ambulatory Visit: Payer: Self-pay | Admitting: Nurse Practitioner

## 2020-09-18 ENCOUNTER — Encounter: Payer: Self-pay | Admitting: Nurse Practitioner

## 2020-09-18 ENCOUNTER — Other Ambulatory Visit: Payer: Self-pay

## 2020-09-18 ENCOUNTER — Ambulatory Visit (INDEPENDENT_AMBULATORY_CARE_PROVIDER_SITE_OTHER): Payer: No Typology Code available for payment source | Admitting: Nurse Practitioner

## 2020-09-18 VITALS — BP 149/91 | HR 71 | Temp 98.3°F | Resp 20 | Ht 72.0 in | Wt 308.0 lb

## 2020-09-18 DIAGNOSIS — F339 Major depressive disorder, recurrent, unspecified: Secondary | ICD-10-CM | POA: Diagnosis not present

## 2020-09-18 DIAGNOSIS — E782 Mixed hyperlipidemia: Secondary | ICD-10-CM

## 2020-09-18 DIAGNOSIS — I10 Essential (primary) hypertension: Secondary | ICD-10-CM

## 2020-09-18 DIAGNOSIS — F3342 Major depressive disorder, recurrent, in full remission: Secondary | ICD-10-CM

## 2020-09-18 MED ORDER — BUPROPION HCL ER (XL) 300 MG PO TB24: 300.0000 mg | ORAL_TABLET | Freq: Every day | ORAL | 1 refills | Status: DC

## 2020-09-18 MED ORDER — ATORVASTATIN CALCIUM 80 MG PO TABS: 80.0000 mg | ORAL_TABLET | Freq: Every day | ORAL | 1 refills | Status: DC

## 2020-09-18 MED ORDER — AMLODIPINE BESY-BENAZEPRIL HCL 5-20 MG PO CAPS: 1.0000 | ORAL_CAPSULE | Freq: Two times a day (BID) | ORAL | 1 refills | Status: DC

## 2020-09-18 NOTE — Progress Notes (Signed)
Subjective:    Patient ID: Antonio Weber, male    DOB: 16-Jan-1961, 59 y.o.   MRN: [id number]   Chief Complaint: Medical Management of Chronic Issues    HPI:  1. Essential hypertension No c/o chest pain, sob or headache. He saw cardiology a few weeks ago. Had doppler study and stress test which were all good. No problems found. BP Readings from Last 3 Encounters:  09/18/20 (!) 149/91  02/16/20 137/89  08/19/19 139/88     2. Mixed hyperlipidemia Does not watch diet very closely. Lab Results  Component Value Date   CHOL 115 02/16/2020   HDL 34 (L) 02/16/2020   LDLCALC 54 02/16/2020   TRIG 160 (H) 02/16/2020   CHOLHDL 3.4 02/16/2020     3. Recurrent major depressive disorder, in full remission ([place]) Is on wellbutrin and is doing well.  Depression screen [facility] 2/9 09/18/2020 02/16/2020 08/19/2019  Decreased Interest 0 0 0  Down, Depressed, Hopeless 0 0 0  PHQ - 2 Score 0 0 0  Altered sleeping 0 - -  Tired, decreased energy 0 - -  Change in appetite 0 - -  Feeling bad or failure about yourself  0 - -  Trouble concentrating 0 - -  Moving slowly or fidgety/restless 0 - -  Suicidal thoughts 0 - -  PHQ-9 Score 0 - -  Difficult doing work/chores Not difficult at all - -     5. Morbid obesity ([place]) No recent weight changes Wt Readings from Last 3 Encounters:  09/18/20 (!) 308 lb (139.7 kg)  02/16/20 (!) 309 lb (140.2 kg)  08/19/19 (!) 316 lb (143.3 kg)   BMI Readings from Last 3 Encounters:  09/18/20 41.77 kg/m  02/16/20 41.91 kg/m  08/19/19 42.86 kg/m       Outpatient Encounter Medications as of 09/18/2020  Medication Sig  . amLODipine-benazepril (LOTREL) 5-20 MG capsule Take 1 capsule by mouth 2 (two) times daily.  Marland Kitchen atorvastatin (LIPITOR) 80 MG tablet Take 1 tablet (80 mg total) by mouth daily at 6 PM.  . buPROPion (WELLBUTRIN XL) 300 MG 24 hr tablet Take 1 tablet (300 mg total) by mouth daily.     Past Surgical History:  Procedure Laterality Date   . Removed MRSA infection in scrotum  [id number]    Family History  Problem Relation Age of Onset  . 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  Eyes: Negative for pain.  Respiratory: Negative for shortness of breath.   Cardiovascular: Negative for chest pain, palpitations and leg swelling.  Gastrointestinal: Negative for abdominal pain.  Endocrine: Negative for polydipsia.  Skin: Negative for rash.  Neurological: Negative for dizziness, weakness and headaches.  Hematological: Does not bruise/bleed easily.  All other systems reviewed and are negative.      Objective:   Physical Exam Vitals and nursing note reviewed.  Constitutional:      Appearance: Normal appearance. He is well-developed and well-nourished.  HENT:     Head: Normocephalic.     Nose: Nose normal.     Mouth/Throat:     Mouth: Oropharynx is clear and moist.  Eyes:     Extraocular Movements: EOM normal.     Pupils: Pupils are equal, round, and reactive to light.  Neck:     Thyroid: No thyroid mass or thyromegaly.     Vascular: No  carotid bruit or JVD.     Trachea: Phonation normal.  Cardiovascular:     Rate and Rhythm: Normal rate and regular rhythm.  Pulmonary:     Effort: Pulmonary effort is normal. No respiratory distress.     Breath sounds: Normal breath sounds.  Abdominal:     General: Bowel sounds are normal. Aorta is normal.     Palpations: Abdomen is soft.     Tenderness: There is no abdominal tenderness.  Musculoskeletal:        General: Normal range of motion.     Cervical back: Normal range of motion and neck supple.  Lymphadenopathy:     Cervical: No cervical adenopathy.  Skin:    General: Skin is warm and dry.  Neurological:     Mental Status: He is alert and oriented to person, place, and time.  Psychiatric:        Mood  and Affect: Mood and affect normal.        Behavior: Behavior normal.        Thought Content: Thought content normal.        Judgment: Judgment normal.    BP (!) 149/91   Pulse 71   Temp 98.3 F (36.8 C) (Temporal)   Resp 20   Ht 6' (1.829 m)   Wt (!) 308 lb (139.7 kg)   SpO2 95%   BMI 41.77 kg/m          Assessment & Plan:  Tuck Dulworth comes in today with chief complaint of Medical Management of Chronic Issues   Diagnosis and orders addressed:  1. Essential hypertension Low sodium diet - amLODipine-benazepril (LOTREL) 5-20 MG capsule; Take 1 capsule by mouth 2 (two) times daily.  Dispense: 180 capsule; Refill: 1 - CBC with Differential/Platelet - CMP14+EGFR  2. Mixed hyperlipidemia Low fat diet - atorvastatin (LIPITOR) 80 MG tablet; Take 1 tablet (80 mg total) by mouth daily at 6 PM.  Dispense: 90 tablet; Refill: 1 - Lipid panel  3. Recurrent major depressive disorder, in full remission ([place]) Stress management - buPROPion (WELLBUTRIN XL) 300 MG 24 hr tablet; Take 1 tablet (300 mg total) by mouth daily.  Dispense: 90 tablet; Refill: 1  4. Morbid obesity ([place]) Discussed diet and exercise for person with BMI >25 Will recheck weight in 3-6 months    Labs pending Health Maintenance reviewed Diet and exercise encouraged  Follow up plan: 6 months   Mary-Margaret Hassell Done, FNP

## 2020-09-19 LAB — LIPID PANEL
Chol/HDL Ratio: 4 ratio (ref 0.0–5.0)
Cholesterol, Total: 143 mg/dL (ref 100–199)
HDL: 36 mg/dL — ABNORMAL LOW (ref >39)
LDL Chol Calc (NIH): 71 mg/dL (ref 0–99)
Triglycerides: 218 mg/dL — ABNORMAL HIGH (ref 0–149)
VLDL Cholesterol Cal: 36 mg/dL (ref 5–40)

## 2020-09-19 LAB — CBC WITH DIFFERENTIAL/PLATELET
Basophils Absolute: 0.1 10*3/uL (ref 0.0–0.2)
Basos: 1 %
EOS (ABSOLUTE): 0.3 10*3/uL (ref 0.0–0.4)
Eos: 3 %
Hematocrit: 47.5 % (ref 37.5–51.0)
Hemoglobin: 15.9 g/dL (ref 13.0–17.7)
Immature Grans (Abs): 0 10*3/uL (ref 0.0–0.1)
Immature Granulocytes: 0 %
Lymphocytes Absolute: 2.8 10*3/uL (ref 0.7–3.1)
Lymphs: 25 %
MCH: 29.3 pg (ref 26.6–33.0)
MCHC: 33.5 g/dL (ref 31.5–35.7)
MCV: 88 fL (ref 79–97)
Monocytes Absolute: 1 10*3/uL — ABNORMAL HIGH (ref 0.1–0.9)
Monocytes: 9 %
Neutrophils Absolute: 6.8 10*3/uL (ref 1.4–7.0)
Neutrophils: 62 %
Platelets: 325 10*3/uL (ref 150–450)
RBC: 5.43 x10E6/uL (ref 4.14–5.80)
RDW: 12.8 % (ref 11.6–15.4)
WBC: 11.1 10*3/uL — ABNORMAL HIGH (ref 3.4–10.8)

## 2020-09-19 LAB — CMP14+EGFR
ALT: 29 IU/L (ref 0–44)
AST: 28 IU/L (ref 0–40)
Albumin/Globulin Ratio: 1.4 (ref 1.2–2.2)
Albumin: 4.1 g/dL (ref 3.8–4.9)
Alkaline Phosphatase: 79 IU/L (ref 44–121)
BUN/Creatinine Ratio: 17 (ref 10–24)
BUN: 19 mg/dL (ref 8–27)
Bilirubin Total: 0.5 mg/dL (ref 0.0–1.2)
CO2: 23 mmol/L (ref 20–29)
Calcium: 8.8 mg/dL (ref 8.6–10.2)
Chloride: 106 mmol/L (ref 96–106)
Creatinine, Ser: 1.09 mg/dL (ref 0.76–1.27)
GFR calc Af Amer: 85 mL/min/1.73
GFR calc non Af Amer: 73 mL/min/1.73
Globulin, Total: 2.9 g/dL (ref 1.5–4.5)
Glucose: 81 mg/dL (ref 65–99)
Potassium: 4.4 mmol/L (ref 3.5–5.2)
Sodium: 141 mmol/L (ref 134–144)
Total Protein: 7 g/dL (ref 6.0–8.5)

## 2021-03-19 ENCOUNTER — Ambulatory Visit: Payer: Self-pay | Admitting: Nurse Practitioner

## 2021-03-28 ENCOUNTER — Other Ambulatory Visit: Payer: Self-pay | Admitting: *Deleted

## 2021-03-28 DIAGNOSIS — F3342 Major depressive disorder, recurrent, in full remission: Secondary | ICD-10-CM

## 2021-03-28 DIAGNOSIS — E782 Mixed hyperlipidemia: Secondary | ICD-10-CM

## 2021-03-28 MED ORDER — ATORVASTATIN CALCIUM 80 MG PO TABS: 80.0000 mg | ORAL_TABLET | Freq: Every day | ORAL | 0 refills | Status: DC

## 2021-03-28 MED ORDER — BUPROPION HCL ER (XL) 300 MG PO TB24: 300.0000 mg | ORAL_TABLET | Freq: Every day | ORAL | 0 refills | Status: DC

## 2021-05-02 ENCOUNTER — Other Ambulatory Visit: Payer: Self-pay | Admitting: Nurse Practitioner

## 2021-05-02 DIAGNOSIS — F3342 Major depressive disorder, recurrent, in full remission: Secondary | ICD-10-CM

## 2021-05-02 MED ORDER — BUPROPION HCL ER (XL) 300 MG PO TB24: 300.0000 mg | ORAL_TABLET | Freq: Every day | ORAL | 3 refills | Status: DC

## 2021-05-18 ENCOUNTER — Other Ambulatory Visit: Payer: Self-pay | Admitting: Nurse Practitioner

## 2021-05-18 DIAGNOSIS — E782 Mixed hyperlipidemia: Secondary | ICD-10-CM

## 2021-05-18 MED ORDER — ATORVASTATIN CALCIUM 80 MG PO TABS
80.0000 mg | ORAL_TABLET | Freq: Every day | ORAL | 0 refills | Status: DC
Start: 2021-05-18 — End: 2021-08-03

## 2021-08-03 ENCOUNTER — Other Ambulatory Visit: Payer: Self-pay | Admitting: Nurse Practitioner

## 2021-08-03 DIAGNOSIS — E782 Mixed hyperlipidemia: Secondary | ICD-10-CM

## 2021-08-03 MED ORDER — ATORVASTATIN CALCIUM 80 MG PO TABS
80.0000 mg | ORAL_TABLET | Freq: Every day | ORAL | 0 refills | Status: DC
Start: 2021-08-03 — End: 2021-09-11

## 2021-08-28 ENCOUNTER — Ambulatory Visit: Payer: No Typology Code available for payment source | Admitting: Nurse Practitioner

## 2021-08-30 ENCOUNTER — Other Ambulatory Visit: Payer: Self-pay | Admitting: Nurse Practitioner

## 2021-08-30 DIAGNOSIS — F3342 Major depressive disorder, recurrent, in full remission: Secondary | ICD-10-CM

## 2021-08-30 MED ORDER — BUPROPION HCL ER (XL) 300 MG PO TB24: 300.0000 mg | ORAL_TABLET | Freq: Every day | ORAL | 3 refills | Status: DC

## 2021-09-11 ENCOUNTER — Encounter: Payer: Self-pay | Admitting: Nurse Practitioner

## 2021-09-11 ENCOUNTER — Ambulatory Visit (INDEPENDENT_AMBULATORY_CARE_PROVIDER_SITE_OTHER): Payer: No Typology Code available for payment source | Admitting: Nurse Practitioner

## 2021-09-11 VITALS — BP 136/82 | HR 84 | Temp 98.1°F | Resp 20 | Ht 72.0 in | Wt 298.0 lb

## 2021-09-11 DIAGNOSIS — I10 Essential (primary) hypertension: Secondary | ICD-10-CM

## 2021-09-11 DIAGNOSIS — Z23 Encounter for immunization: Secondary | ICD-10-CM

## 2021-09-11 DIAGNOSIS — E782 Mixed hyperlipidemia: Secondary | ICD-10-CM

## 2021-09-11 DIAGNOSIS — F3342 Major depressive disorder, recurrent, in full remission: Secondary | ICD-10-CM

## 2021-09-11 MED ORDER — ATORVASTATIN CALCIUM 80 MG PO TABS: 80.0000 mg | ORAL_TABLET | Freq: Every day | ORAL | 1 refills | Status: DC

## 2021-09-11 MED ORDER — PANTOPRAZOLE SODIUM 40 MG PO TBEC: 40.0000 mg | DELAYED_RELEASE_TABLET | Freq: Every day | ORAL | 1 refills | Status: DC

## 2021-09-11 MED ORDER — AMLODIPINE BESY-BENAZEPRIL HCL 5-20 MG PO CAPS: 1.0000 | ORAL_CAPSULE | Freq: Two times a day (BID) | ORAL | 1 refills | Status: DC

## 2021-09-11 MED ORDER — BUPROPION HCL ER (XL) 300 MG PO TB24: 300.0000 mg | ORAL_TABLET | Freq: Every day | ORAL | 1 refills | Status: DC

## 2021-09-11 NOTE — Addendum Note (Signed)
Addended by: Cleda Daub on: 09/11/2021 10:33 AM   Modules accepted: Orders

## 2021-09-11 NOTE — Patient Instructions (Signed)
Exercising to Stay Healthy °To become healthy and stay healthy, it is recommended that you do moderate-intensity and vigorous-intensity exercise. You can tell that you are exercising at a moderate intensity if your heart starts beating faster and you start breathing faster but can still hold a conversation. You can tell that you are exercising at a vigorous intensity if you are breathing much harder and faster and cannot hold a conversation while exercising. °How can exercise benefit me? °Exercising regularly is important. It has many health benefits, such as: °Improving overall fitness, flexibility, and endurance. °Increasing bone density. °Helping with weight control. °Decreasing body fat. °Increasing muscle strength and endurance. °Reducing stress and tension, anxiety, depression, or anger. °Improving overall health. °What guidelines should I follow while exercising? °Before you start a new exercise program, talk with your health care provider. °Do not exercise so much that you hurt yourself, feel dizzy, or get very short of breath. °Wear comfortable clothes and wear shoes with good support. °Drink plenty of water while you exercise to prevent dehydration or heat stroke. °Work out until your breathing and your heartbeat get faster (moderate intensity). °How often should I exercise? °Choose an activity that you enjoy, and set realistic goals. Your health care provider can help you make an activity plan that is individually designed and works best for you. °Exercise regularly as told by your health care provider. This may include: °Doing strength training two times a week, such as: °Lifting weights. °Using resistance bands. °Push-ups. °Sit-ups. °Yoga. °Doing a certain intensity of exercise for a given amount of time. Choose from these options: °A total of 150 minutes of moderate-intensity exercise every week. °A total of 75 minutes of vigorous-intensity exercise every week. °A mix of moderate-intensity and  vigorous-intensity exercise every week. °Children, pregnant women, people who have not exercised regularly, people who are overweight, and older adults may need to talk with a health care provider about what activities are safe to perform. If you have a medical condition, be sure to talk with your health care provider before you start a new exercise program. °What are some exercise ideas? °Moderate-intensity exercise ideas include: °Walking 1 mile (1.6 km) in about 15 minutes. °Biking. °Hiking. °Golfing. °Dancing. °Water aerobics. °Vigorous-intensity exercise ideas include: °Walking 4.5 miles (7.2 km) or more in about 1 hour. °Jogging or running 5 miles (8 km) in about 1 hour. °Biking 10 miles (16.1 km) or more in about 1 hour. °Lap swimming. °Roller-skating or in-line skating. °Cross-country skiing. °Vigorous competitive sports, such as football, basketball, and soccer. °Jumping rope. °Aerobic dancing. °What are some everyday activities that can help me get exercise? °Yard work, such as: °Pushing a lawn mower. °Raking and bagging leaves. °Washing your car. °Pushing a stroller. °Shoveling snow. °Gardening. °Washing windows or floors. °How can I be more active in my day-to-day activities? °Use stairs instead of an elevator. °Take a walk during your lunch break. °If you drive, park your car farther away from your work or school. °If you take public transportation, get off one stop early and walk the rest of the way. °Stand up or walk around during all of your indoor phone calls. °Get up, stretch, and walk around every 30 minutes throughout the day. °Enjoy exercise with a friend. Support to continue exercising will help you keep a regular routine of activity. °Where to find more information °You can find more information about exercising to stay healthy from: °U.S. Department of Health and Human Services: www.hhs.gov °Centers for Disease Control and Prevention (  CDC): www.cdc.gov °Summary °Exercising regularly is  important. It will improve your overall fitness, flexibility, and endurance. °Regular exercise will also improve your overall health. It can help you control your weight, reduce stress, and improve your bone density. °Do not exercise so much that you hurt yourself, feel dizzy, or get very short of breath. °Before you start a new exercise program, talk with your health care provider. °This information is not intended to replace advice given to you by your health care provider. Make sure you discuss any questions you have with your health care provider. °Document Revised: 01/12/2021 Document Reviewed: 01/12/2021 °Elsevier Patient Education © 2022 Elsevier Inc. ° °

## 2021-09-11 NOTE — Progress Notes (Signed)
Subjective:    Patient ID: Antonio Weber, male    DOB: [birth year], 60 y.o.   MRN: [id number]   Chief Complaint: medical management of chronic issues     HPI:  1. Essential hypertension No c/o chest pain, sob or headache. His wife will occasionaly check his blood pressure when he is not feeling well.  BP Readings from Last 3 Encounters:  09/11/21 (!) 141/81  09/18/20 (!) 149/91  02/16/20 137/89      2. Mixed hyperlipidemia Has been watch diet and staying very active.  Lab Results  Component Value Date   CHOL 143 09/18/2020   HDL 36 (L) 09/18/2020   LDLCALC 71 09/18/2020   TRIG 218 (H) 09/18/2020   CHOLHDL 4.0 09/18/2020      3. Recurrent major depressive disorder, in full remission [facility]) He is currently on wellbutrin and says he is doing well. Depression screen [facility] 2/9 09/11/2021 09/18/2020 02/16/2020  Decreased Interest 0 0 0  Down, Depressed, Hopeless 0 0 0  PHQ - 2 Score 0 0 0  Altered sleeping 0 0 -  Tired, decreased energy 0 0 -  Change in appetite 0 0 -  Feeling bad or failure about yourself  0 0 -  Trouble concentrating 0 0 -  Moving slowly or fidgety/restless 0 0 -  Suicidal thoughts 0 0 -  PHQ-9 Score 0 0 -  Difficult doing work/chores Not difficult at all Not difficult at all -     4. Morbid obesity ([place]) He was started on ozempic several months ago. Is working well to decrease his appetite. Weight is down 10lbs Wt Readings from Last 3 Encounters:  09/11/21 298 lb (135.2 kg)  09/18/20 (!) 308 lb (139.7 kg)  02/16/20 (!) 309 lb (140.2 kg)   BMI Readings from Last 3 Encounters:  09/11/21 40.42 kg/m  09/18/20 41.77 kg/m  02/16/20 41.91 kg/m      Outpatient Encounter Medications as of 09/11/2021  Medication Sig   amLODipine-benazepril (LOTREL) 5-20 MG capsule Take 1 capsule by mouth 2 (two) times daily.   atorvastatin (LIPITOR) 80 MG tablet Take 1 tablet (80 mg total) by mouth daily at 6 PM. (NEEDS TO BE SEEN BEFORE NEXT REFILL)    buPROPion (WELLBUTRIN XL) 300 MG 24 hr tablet Take 1 tablet (300 mg total) by mouth daily. (NEEDS TO BE SEEN BEFORE NEXT REFILL)   No facility-administered encounter medications on file as of 09/11/2021.    Past Surgical History:  Procedure Laterality Date   Removed MRSA infection in scrotum  [id number]    Family History  Problem Relation Age of Onset   Hypertension Father     New complaints: None today  Social history: Lives with is wife. Is a retired Scientist, research (medical): n/a     Review of Systems  Constitutional:  Negative for diaphoresis.  Eyes:  Negative for pain.  Respiratory:  Negative for shortness of breath.   Cardiovascular:  Negative for chest pain, palpitations and leg swelling.  Gastrointestinal:  Negative for abdominal pain.  Endocrine: Negative for polydipsia.  Skin:  Negative for rash.  Neurological:  Negative for dizziness, weakness and headaches.  Hematological:  Does not bruise/bleed easily.  All other systems reviewed and are negative.     Objective:   Physical Exam Vitals and nursing note reviewed.  Constitutional:      Appearance: Normal appearance. He is well-developed.  HENT:     Head: Normocephalic.     Nose: Nose normal.  Mouth/Throat:     Mouth: Mucous membranes are moist.     Pharynx: Oropharynx is clear.  Eyes:     Pupils: Pupils are equal, round, and reactive to light.  Neck:     Thyroid: No thyroid mass or thyromegaly.     Vascular: No carotid bruit or JVD.     Trachea: Phonation normal.  Cardiovascular:     Rate and Rhythm: Normal rate and regular rhythm.  Pulmonary:     Effort: Pulmonary effort is normal. No respiratory distress.     Breath sounds: Normal breath sounds.  Abdominal:     General: Bowel sounds are normal.     Palpations: Abdomen is soft.     Tenderness: There is no abdominal tenderness.  Musculoskeletal:        General: Normal range of motion.     Cervical back: Normal range of  motion and neck supple.  Lymphadenopathy:     Cervical: No cervical adenopathy.  Skin:    General: Skin is warm and dry.  Neurological:     Mental Status: He is alert and oriented to person, place, and time.  Psychiatric:        Behavior: Behavior normal.        Thought Content: Thought content normal.        Judgment: Judgment normal.    BP (!) 141/81   Pulse 84   Temp 98.1 F (36.7 C) (Temporal)   Resp 20   Ht 6' (1.829 m)   Wt 298 lb (135.2 kg)   SpO2 95%   BMI 40.42 kg/m        Assessment & Plan:   Antonio Weber comes in today with chief complaint of Medical Management of Chronic Issues   Diagnosis and orders addressed:  1. Essential hypertension Low sodium diet - amLODipine-benazepril (LOTREL) 5-20 MG capsule; Take 1 capsule by mouth 2 (two) times daily.  Dispense: 180 capsule; Refill: 1 - CBC with Differential/Platelet - CMP14+EGFR  2. Mixed hyperlipidemia Low fat diet - atorvastatin (LIPITOR) 80 MG tablet; Take 1 tablet (80 mg total) by mouth daily at 6 PM. (NEEDS TO BE SEEN BEFORE NEXT REFILL)  Dispense: 90 tablet; Refill: 1 - Lipid panel  3. Recurrent major depressive disorder, in full remission ([place]) Stress management - buPROPion (WELLBUTRIN XL) 300 MG 24 hr tablet; Take 1 tablet (300 mg total) by mouth daily. (NEEDS TO BE SEEN BEFORE NEXT REFILL)  Dispense: 90 tablet; Refill: 1  4. Morbid obesity ([place]) Discussed diet and exercise for person with BMI >25 Will recheck weight in 3-6 months    Labs pending Health Maintenance reviewed- will schedule colonoscopy Diet and exercise encouraged  Follow up plan: 6 months   Mary-Margaret Hassell Done, FNP

## 2021-09-12 LAB — CMP14+EGFR
ALT: 28 IU/L (ref 0–44)
AST: 22 IU/L (ref 0–40)
Albumin/Globulin Ratio: 1.7 (ref 1.2–2.2)
Albumin: 4.2 g/dL (ref 3.8–4.8)
Alkaline Phosphatase: 78 IU/L (ref 44–121)
BUN/Creatinine Ratio: 12 (ref 10–24)
BUN: 14 mg/dL (ref 8–27)
Bilirubin Total: 0.4 mg/dL (ref 0.0–1.2)
CO2: 22 mmol/L (ref 20–29)
Calcium: 9.3 mg/dL (ref 8.6–10.2)
Chloride: 104 mmol/L (ref 96–106)
Creatinine, Ser: 1.14 mg/dL (ref 0.76–1.27)
Globulin, Total: 2.5 g/dL (ref 1.5–4.5)
Glucose: 96 mg/dL (ref 70–99)
Potassium: 5.1 mmol/L (ref 3.5–5.2)
Sodium: 139 mmol/L (ref 134–144)
Total Protein: 6.7 g/dL (ref 6.0–8.5)
eGFR: 73 mL/min/{1.73_m2} (ref >59)

## 2021-09-12 LAB — CBC WITH DIFFERENTIAL/PLATELET
Basophils Absolute: 0.1 10*3/uL (ref 0.0–0.2)
Basos: 1 %
EOS (ABSOLUTE): 0.2 10*3/uL (ref 0.0–0.4)
Eos: 3 %
Hematocrit: 47.4 % (ref 37.5–51.0)
Hemoglobin: 15.4 g/dL (ref 13.0–17.7)
Immature Grans (Abs): 0 10*3/uL (ref 0.0–0.1)
Immature Granulocytes: 0 %
Lymphocytes Absolute: 2.2 10*3/uL (ref 0.7–3.1)
Lymphs: 24 %
MCH: 28.2 pg (ref 26.6–33.0)
MCHC: 32.5 g/dL (ref 31.5–35.7)
MCV: 87 fL (ref 79–97)
Monocytes Absolute: 0.9 10*3/uL (ref 0.1–0.9)
Monocytes: 10 %
Neutrophils Absolute: 5.7 10*3/uL (ref 1.4–7.0)
Neutrophils: 62 %
Platelets: 329 10*3/uL (ref 150–450)
RBC: 5.47 x10E6/uL (ref 4.14–5.80)
RDW: 12.7 % (ref 11.6–15.4)
WBC: 9.2 10*3/uL (ref 3.4–10.8)

## 2021-09-12 LAB — LIPID PANEL
Chol/HDL Ratio: 2.7 ratio (ref 0.0–5.0)
Cholesterol, Total: 101 mg/dL (ref 100–199)
HDL: 37 mg/dL — ABNORMAL LOW (ref >39)
LDL Chol Calc (NIH): 47 mg/dL (ref 0–99)
Triglycerides: 87 mg/dL (ref 0–149)
VLDL Cholesterol Cal: 17 mg/dL (ref 5–40)

## 2022-03-12 ENCOUNTER — Ambulatory Visit: Payer: No Typology Code available for payment source | Admitting: Nurse Practitioner

## 2022-03-14 ENCOUNTER — Encounter: Payer: Self-pay | Admitting: Nurse Practitioner

## 2022-03-14 ENCOUNTER — Ambulatory Visit (INDEPENDENT_AMBULATORY_CARE_PROVIDER_SITE_OTHER): Payer: No Typology Code available for payment source | Admitting: Nurse Practitioner

## 2022-03-14 VITALS — BP 132/80 | HR 80 | Temp 97.3°F | Resp 20 | Ht 72.0 in | Wt 293.0 lb

## 2022-03-14 DIAGNOSIS — F3342 Major depressive disorder, recurrent, in full remission: Secondary | ICD-10-CM | POA: Diagnosis not present

## 2022-03-14 DIAGNOSIS — Z23 Encounter for immunization: Secondary | ICD-10-CM

## 2022-03-14 DIAGNOSIS — K219 Gastro-esophageal reflux disease without esophagitis: Secondary | ICD-10-CM

## 2022-03-14 DIAGNOSIS — E782 Mixed hyperlipidemia: Secondary | ICD-10-CM | POA: Diagnosis not present

## 2022-03-14 DIAGNOSIS — I10 Essential (primary) hypertension: Secondary | ICD-10-CM

## 2022-03-14 MED ORDER — SEMAGLUTIDE (2 MG/DOSE) 8 MG/3ML ~~LOC~~ SOPN: 2.0000 mg | PEN_INJECTOR | SUBCUTANEOUS | 5 refills | Status: DC

## 2022-03-14 MED ORDER — PANTOPRAZOLE SODIUM 40 MG PO TBEC: 40.0000 mg | DELAYED_RELEASE_TABLET | Freq: Every day | ORAL | 1 refills | Status: DC

## 2022-03-14 MED ORDER — AMLODIPINE BESY-BENAZEPRIL HCL 5-20 MG PO CAPS: 1.0000 | ORAL_CAPSULE | Freq: Two times a day (BID) | ORAL | 1 refills | Status: DC

## 2022-03-14 MED ORDER — ATORVASTATIN CALCIUM 80 MG PO TABS: 80.0000 mg | ORAL_TABLET | Freq: Every day | ORAL | 1 refills | Status: DC

## 2022-03-14 MED ORDER — BUPROPION HCL ER (XL) 300 MG PO TB24: 300.0000 mg | ORAL_TABLET | Freq: Every day | ORAL | 1 refills | Status: DC

## 2022-03-14 NOTE — Patient Instructions (Signed)
Exercising to Stay Healthy To become healthy and stay healthy, it is recommended that you do moderate-intensity and vigorous-intensity exercise. You can tell that you are exercising at a moderate intensity if your heart starts beating faster and you start breathing faster but can still hold a conversation. You can tell that you are exercising at a vigorous intensity if you are breathing much harder and faster and cannot hold a conversation while exercising. How can exercise benefit me? Exercising regularly is important. It has many health benefits, such as: Improving overall fitness, flexibility, and endurance. Increasing bone density. Helping with weight control. Decreasing body fat. Increasing muscle strength and endurance. Reducing stress and tension, anxiety, depression, or anger. Improving overall health. What guidelines should I follow while exercising? Before you start a new exercise program, talk with your health care provider. Do not exercise so much that you hurt yourself, feel dizzy, or get very short of breath. Wear comfortable clothes and wear shoes with good support. Drink plenty of water while you exercise to prevent dehydration or heat stroke. Work out until your breathing and your heartbeat get faster (moderate intensity). How often should I exercise? Choose an activity that you enjoy, and set realistic goals. Your health care provider can help you make an activity plan that is individually designed and works best for you. Exercise regularly as told by your health care provider. This may include: Doing strength training two times a week, such as: Lifting weights. Using resistance bands. Push-ups. Sit-ups. Yoga. Doing a certain intensity of exercise for a given amount of time. Choose from these options: A total of 150 minutes of moderate-intensity exercise every week. A total of 75 minutes of vigorous-intensity exercise every week. A mix of moderate-intensity and  vigorous-intensity exercise every week. Children, pregnant women, people who have not exercised regularly, people who are overweight, and older adults may need to talk with a health care provider about what activities are safe to perform. If you have a medical condition, be sure to talk with your health care provider before you start a new exercise program. What are some exercise ideas? Moderate-intensity exercise ideas include: Walking 1 mile (1.6 km) in about 15 minutes. Biking. Hiking. Golfing. Dancing. Water aerobics. Vigorous-intensity exercise ideas include: Walking 4.5 miles (7.2 km) or more in about 1 hour. Jogging or running 5 miles (8 km) in about 1 hour. Biking 10 miles (16.1 km) or more in about 1 hour. Lap swimming. Roller-skating or in-line skating. Cross-country skiing. Vigorous competitive sports, such as football, basketball, and soccer. Jumping rope. Aerobic dancing. What are some everyday activities that can help me get exercise? Yard work, such as: Pushing a lawn mower. Raking and bagging leaves. Washing your car. Pushing a stroller. Shoveling snow. Gardening. Washing windows or floors. How can I be more active in my day-to-day activities? Use stairs instead of an elevator. Take a walk during your lunch break. If you drive, park your car farther away from your work or school. If you take public transportation, get off one stop early and walk the rest of the way. Stand up or walk around during all of your indoor phone calls. Get up, stretch, and walk around every 30 minutes throughout the day. Enjoy exercise with a friend. Support to continue exercising will help you keep a regular routine of activity. Where to find more information You can find more information about exercising to stay healthy from: U.S. Department of Health and Human Services: www.hhs.gov Centers for Disease Control and Prevention (  CDC): www.cdc.gov Summary Exercising regularly is  important. It will improve your overall fitness, flexibility, and endurance. Regular exercise will also improve your overall health. It can help you control your weight, reduce stress, and improve your bone density. Do not exercise so much that you hurt yourself, feel dizzy, or get very short of breath. Before you start a new exercise program, talk with your health care provider. This information is not intended to replace advice given to you by your health care provider. Make sure you discuss any questions you have with your health care provider. Document Revised: 01/12/2021 Document Reviewed: 01/12/2021 Elsevier Patient Education  2023 Elsevier Inc.  

## 2022-03-14 NOTE — Addendum Note (Signed)
Addended by: Cleda Daub on: 03/14/2022 11:25 AM   Modules accepted: Orders

## 2022-03-14 NOTE — Progress Notes (Signed)
Subjective:    Patient ID: Antonio Weber, male    DOB: Mar 30, 1961, 61 y.o.   MRN: [id number]   Chief Complaint: medical management of chronic issues     HPI:  Antonio Weber is a 61 y.o. who identifies as a male who was assigned male at birth.   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 Essential hypertension No c/o chest pain, sob or headache.does not check blood pressure at home unless he feels bad. BP Readings from Last 3 Encounters:  09/11/21 136/82  09/18/20 (!) 149/91  02/16/20 137/89     2. Mixed hyperlipidemia Does try to wtahc diet. Stays very active but does no dedicated exercise. Lab Results  Component Value Date   CHOL 101 09/11/2021   HDL 37 (L) 09/11/2021   LDLCALC 47 09/11/2021   TRIG 87 09/11/2021   CHOLHDL 2.7 09/11/2021   The ASCVD Risk score ([name], et al., 2019) failed to calculate for the following reasons:   The valid total cholesterol range is 130 to 320 mg/dL   3. Recurrent major depressive disorder, in full remission ([place]) Ison wellbutrin an dis doing well.    03/14/2022    9:02 AM 09/11/2021    9:23 AM 09/18/2020    3:14 PM 02/16/2020    8:41 AM 08/19/2019   12:02 PM  Depression screen PHQ 2/9  Decreased Interest 0 0 0 0 0  Down, Depressed, Hopeless 0 0 0 0 0  PHQ - 2 Score 0 0 0 0 0  Altered sleeping 0 0 0    Tired, decreased energy 0 0 0    Change in appetite 1 0 0    Feeling bad or failure about yourself  0 0 0    Trouble concentrating 0 0 0    Moving slowly or fidgety/restless 0 0 0    Suicidal thoughts 0 0 0    PHQ-9 Score 1 0 0    Difficult doing work/chores Not difficult at all Not difficult at all Not difficult at all       4. Morbid obesity ([place]) Weight is down 5lbs Wt Readings from Last 3 Encounters:  03/14/22 293 lb (132.9 kg)  09/11/21 298 lb (135.2 kg)  09/18/20 (!) 308 lb (139.7 kg)   BMI Readings  from Last 3 Encounters:  03/14/22 39.74 kg/m  09/11/21 40.42 kg/m  09/18/20 41.77 kg/m     New complaints: None today  Allergies  Allergen Reactions   Penicillins Shortness Of Breath   Outpatient Encounter Medications as of 03/14/2022  Medication Sig   amLODipine-benazepril (LOTREL) 5-20 MG capsule Take 1 capsule by mouth 2 (two) times daily.   atorvastatin (LIPITOR) 80 MG tablet Take 1 tablet (80 mg total) by mouth daily at 6 PM. (NEEDS TO BE SEEN BEFORE NEXT REFILL)   buPROPion (WELLBUTRIN XL) 300 MG 24 hr tablet Take 1 tablet (300 mg total) by mouth daily. (NEEDS TO BE SEEN BEFORE NEXT REFILL)   pantoprazole (PROTONIX) 40 MG tablet Take 1 tablet (40 mg total) by mouth daily.   No facility-administered encounter medications on file as of 03/14/2022.    Past Surgical History:  Procedure Laterality Date   Removed MRSA infection in scrotum  [id number]    Family History  Problem Relation Age of Onset   Hypertension Father       Controlled substance contract: n/a  Review of Systems  Constitutional:  Negative for diaphoresis.  Eyes:  Negative for pain.  Respiratory:  Negative for shortness of breath.   Cardiovascular:  Negative for chest pain, palpitations and leg swelling.  Gastrointestinal:  Negative for abdominal pain.  Endocrine: Negative for polydipsia.  Skin:  Negative for rash.  Neurological:  Negative for dizziness, weakness and headaches.  Hematological:  Does not bruise/bleed easily.  All other systems reviewed and are negative.      Objective:   Physical Exam Vitals and nursing note reviewed.  Constitutional:      Appearance: Normal appearance. He is well-developed.  HENT:     Head: Normocephalic.     Nose: Nose normal.     Mouth/Throat:     Mouth: Mucous membranes are moist.     Pharynx: Oropharynx is clear.  Eyes:     Pupils: Pupils are equal, round, and reactive to light.  Neck:     Thyroid: No thyroid mass or thyromegaly.      Vascular: No carotid bruit or JVD.     Trachea: Phonation normal.  Cardiovascular:     Rate and Rhythm: Normal rate and regular rhythm.  Pulmonary:     Effort: Pulmonary effort is normal. No respiratory distress.     Breath sounds: Normal breath sounds.  Abdominal:     General: Bowel sounds are normal.     Palpations: Abdomen is soft.     Tenderness: There is no abdominal tenderness.  Musculoskeletal:        General: Normal range of motion.     Cervical back: Normal range of motion and neck supple.  Lymphadenopathy:     Cervical: No cervical adenopathy.  Skin:    General: Skin is warm and dry.  Neurological:     Mental Status: He is alert and oriented to person, place, and time.  Psychiatric:        Behavior: Behavior normal.        Thought Content: Thought content normal.        Judgment: Judgment normal.    BP 132/80   Pulse 80   Temp (!) 97.3 F (36.3 C) (Temporal)   Resp 20   Ht 6' (1.829 m)   Wt 293 lb (132.9 kg)   SpO2 94%   BMI 39.74 kg/m         Assessment & Plan:   Antonio Weber comes in today with chief complaint of Medical Management of Chronic Issues   Diagnosis and orders addressed:  1. Essential hypertension Low sodium diet - PSA, total and free - CBC with Differential/Platelet - CMP14+EGFR - amLODipine-benazepril (LOTREL) 5-20 MG capsule; Take 1 capsule by mouth 2 (two) times daily.  Dispense: 180 capsule; Refill: 1  2. Mixed hyperlipidemia Low fat diet - Lipid panel - atorvastatin (LIPITOR) 80 MG tablet; Take 1 tablet (80 mg total) by mouth daily at 6 PM. (NEEDS TO BE SEEN BEFORE NEXT REFILL)  Dispense: 90 tablet; Refill: 1  3. Recurrent major depressive disorder, in full remission ([place]) Stress management - buPROPion (WELLBUTRIN XL) 300 MG 24 hr tablet; Take 1 tablet (300 mg total) by mouth daily. (NEEDS TO BE SEEN BEFORE NEXT REFILL)  Dispense: 90 tablet; Refill: 1  4. Morbid obesity ([place]) Discussed diet and exercise for person with  BMI >25 Will recheck weight in 3-6 months - Semaglutide, 2 MG/DOSE, 8 MG/3ML SOPN; Inject 2 mg as directed once a week.  Dispense: 3 mL; Refill: 5  5. Gastroesophageal reflux disease  without esophagitis Avoid spicy foods Do not eat 2 hours prior to bedtime  - pantoprazole (PROTONIX) 40 MG tablet; Take 1 tablet (40 mg total) by mouth daily.  Dispense: 90 tablet; Refill: 1   Labs pending Health Maintenance reviewed Diet and exercise encouraged  Follow up plan: 6 months   Mary-Margaret Hassell Done, FNP

## 2022-03-15 LAB — LIPID PANEL
Chol/HDL Ratio: 2.8 ratio (ref 0.0–5.0)
Cholesterol, Total: 105 mg/dL (ref 100–199)
HDL: 38 mg/dL — ABNORMAL LOW (ref >39)
LDL Chol Calc (NIH): 50 mg/dL (ref 0–99)
Triglycerides: 86 mg/dL (ref 0–149)
VLDL Cholesterol Cal: 17 mg/dL (ref 5–40)

## 2022-03-15 LAB — CBC WITH DIFFERENTIAL/PLATELET
Basophils Absolute: 0.1 10*3/uL (ref 0.0–0.2)
Basos: 1 %
EOS (ABSOLUTE): 0.3 10*3/uL (ref 0.0–0.4)
Eos: 4 %
Hematocrit: 45.5 % (ref 37.5–51.0)
Hemoglobin: 15.4 g/dL (ref 13.0–17.7)
Immature Grans (Abs): 0 10*3/uL (ref 0.0–0.1)
Immature Granulocytes: 0 %
Lymphocytes Absolute: 1.9 10*3/uL (ref 0.7–3.1)
Lymphs: 23 %
MCH: 29.3 pg (ref 26.6–33.0)
MCHC: 33.8 g/dL (ref 31.5–35.7)
MCV: 87 fL (ref 79–97)
Monocytes Absolute: 1 10*3/uL — ABNORMAL HIGH (ref 0.1–0.9)
Monocytes: 12 %
Neutrophils Absolute: 5 10*3/uL (ref 1.4–7.0)
Neutrophils: 60 %
Platelets: 230 10*3/uL (ref 150–450)
RBC: 5.26 x10E6/uL (ref 4.14–5.80)
RDW: 13 % (ref 11.6–15.4)
WBC: 8.2 10*3/uL (ref 3.4–10.8)

## 2022-03-15 LAB — CMP14+EGFR
ALT: 40 IU/L (ref 0–44)
AST: 26 IU/L (ref 0–40)
Albumin/Globulin Ratio: 1.4 (ref 1.2–2.2)
Albumin: 3.9 g/dL (ref 3.8–4.8)
Alkaline Phosphatase: 71 IU/L (ref 44–121)
BUN/Creatinine Ratio: 12 (ref 10–24)
BUN: 12 mg/dL (ref 8–27)
Bilirubin Total: 0.6 mg/dL (ref 0.0–1.2)
CO2: 20 mmol/L (ref 20–29)
Calcium: 9.3 mg/dL (ref 8.6–10.2)
Chloride: 106 mmol/L (ref 96–106)
Creatinine, Ser: 1.02 mg/dL (ref 0.76–1.27)
Globulin, Total: 2.7 g/dL (ref 1.5–4.5)
Glucose: 92 mg/dL (ref 70–99)
Potassium: 4.7 mmol/L (ref 3.5–5.2)
Sodium: 141 mmol/L (ref 134–144)
Total Protein: 6.6 g/dL (ref 6.0–8.5)
eGFR: 84 mL/min/{1.73_m2} (ref >59)

## 2022-03-15 LAB — PSA, TOTAL AND FREE
PSA, Free Pct: 40 %
PSA, Free: 0.12 ng/mL
Prostate Specific Ag, Serum: 0.3 ng/mL (ref 0.0–4.0)

## 2022-04-20 ENCOUNTER — Other Ambulatory Visit: Payer: Self-pay | Admitting: Nurse Practitioner

## 2022-04-20 MED ORDER — DOXYCYCLINE HYCLATE 100 MG PO TABS: 100.0000 mg | ORAL_TABLET | Freq: Two times a day (BID) | ORAL | 0 refills | Status: DC

## 2022-08-20 ENCOUNTER — Other Ambulatory Visit: Payer: Self-pay | Admitting: Nurse Practitioner

## 2022-08-20 MED ORDER — PREDNISONE 10 MG (21) PO TBPK: ORAL_TABLET | ORAL | 0 refills | Status: DC

## 2022-09-16 ENCOUNTER — Encounter: Payer: Self-pay | Admitting: Nurse Practitioner

## 2022-09-16 ENCOUNTER — Ambulatory Visit (INDEPENDENT_AMBULATORY_CARE_PROVIDER_SITE_OTHER): Payer: No Typology Code available for payment source | Admitting: Nurse Practitioner

## 2022-09-16 VITALS — BP 126/73 | HR 78 | Temp 97.9°F | Resp 20 | Ht 72.0 in | Wt 303.0 lb

## 2022-09-16 DIAGNOSIS — Z23 Encounter for immunization: Secondary | ICD-10-CM

## 2022-09-16 DIAGNOSIS — E782 Mixed hyperlipidemia: Secondary | ICD-10-CM

## 2022-09-16 DIAGNOSIS — F3342 Major depressive disorder, recurrent, in full remission: Secondary | ICD-10-CM | POA: Diagnosis not present

## 2022-09-16 DIAGNOSIS — Z6841 Body Mass Index (BMI) 40.0 and over, adult: Secondary | ICD-10-CM

## 2022-09-16 DIAGNOSIS — I10 Essential (primary) hypertension: Secondary | ICD-10-CM | POA: Diagnosis not present

## 2022-09-16 DIAGNOSIS — K219 Gastro-esophageal reflux disease without esophagitis: Secondary | ICD-10-CM

## 2022-09-16 LAB — CMP14+EGFR
ALT: 30 IU/L (ref 0–44)
AST: 27 IU/L (ref 0–40)
Albumin/Globulin Ratio: 1.6 (ref 1.2–2.2)
Albumin: 4.1 g/dL (ref 3.9–4.9)
Alkaline Phosphatase: 70 IU/L (ref 44–121)
BUN/Creatinine Ratio: 15 (ref 10–24)
BUN: 16 mg/dL (ref 8–27)
Bilirubin Total: 0.6 mg/dL (ref 0.0–1.2)
CO2: 20 mmol/L (ref 20–29)
Calcium: 9.1 mg/dL (ref 8.6–10.2)
Chloride: 105 mmol/L (ref 96–106)
Creatinine, Ser: 1.05 mg/dL (ref 0.76–1.27)
Globulin, Total: 2.5 g/dL (ref 1.5–4.5)
Glucose: 107 mg/dL — ABNORMAL HIGH (ref 70–99)
Potassium: 4.7 mmol/L (ref 3.5–5.2)
Sodium: 138 mmol/L (ref 134–144)
Total Protein: 6.6 g/dL (ref 6.0–8.5)
eGFR: 80 mL/min/{1.73_m2} (ref >59)

## 2022-09-16 LAB — LIPID PANEL
Chol/HDL Ratio: 2.8 ratio (ref 0.0–5.0)
Cholesterol, Total: 125 mg/dL (ref 100–199)
HDL: 44 mg/dL (ref >39)
LDL Chol Calc (NIH): 60 mg/dL (ref 0–99)
Triglycerides: 116 mg/dL (ref 0–149)
VLDL Cholesterol Cal: 21 mg/dL (ref 5–40)

## 2022-09-16 LAB — CBC WITH DIFFERENTIAL/PLATELET
Basophils Absolute: 0.1 10*3/uL (ref 0.0–0.2)
Basos: 1 %
EOS (ABSOLUTE): 0.3 10*3/uL (ref 0.0–0.4)
Eos: 4 %
Hematocrit: 46.1 % (ref 37.5–51.0)
Hemoglobin: 15.2 g/dL (ref 13.0–17.7)
Immature Grans (Abs): 0 10*3/uL (ref 0.0–0.1)
Immature Granulocytes: 0 %
Lymphocytes Absolute: 2.1 10*3/uL (ref 0.7–3.1)
Lymphs: 30 %
MCH: 29.1 pg (ref 26.6–33.0)
MCHC: 33 g/dL (ref 31.5–35.7)
MCV: 88 fL (ref 79–97)
Monocytes Absolute: 0.8 10*3/uL (ref 0.1–0.9)
Monocytes: 11 %
Neutrophils Absolute: 3.8 10*3/uL (ref 1.4–7.0)
Neutrophils: 54 %
Platelets: 235 10*3/uL (ref 150–450)
RBC: 5.23 x10E6/uL (ref 4.14–5.80)
RDW: 12.7 % (ref 11.6–15.4)
WBC: 6.9 10*3/uL (ref 3.4–10.8)

## 2022-09-16 MED ORDER — PANTOPRAZOLE SODIUM 40 MG PO TBEC: 40.0000 mg | DELAYED_RELEASE_TABLET | Freq: Every day | ORAL | 1 refills | Status: DC

## 2022-09-16 MED ORDER — AMLODIPINE BESY-BENAZEPRIL HCL 5-20 MG PO CAPS: 1.0000 | ORAL_CAPSULE | Freq: Two times a day (BID) | ORAL | 1 refills | Status: DC

## 2022-09-16 MED ORDER — ATORVASTATIN CALCIUM 80 MG PO TABS
80.0000 mg | ORAL_TABLET | Freq: Every day | ORAL | 1 refills | Status: DC
Start: 2022-09-16 — End: 2023-03-20

## 2022-09-16 MED ORDER — BUPROPION HCL ER (XL) 300 MG PO TB24: 300.0000 mg | ORAL_TABLET | Freq: Every day | ORAL | 1 refills | Status: DC

## 2022-09-16 NOTE — Progress Notes (Signed)
Subjective:    Patient ID: Antonio Weber, male    DOB: 06-15-1961, 61 y.o.   MRN: [id number]   Chief Complaint: medical management of chronic issues     HPI:  Antonio Weber is a 61 y.o. who identifies as a male who was assigned male at birth.   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 Essential hypertension No c/o chest pain, sob or headache. Does not check blood pressure at home. BP Readings from Last 3 Encounters:  03/14/22 132/80  09/11/21 136/82  09/18/20 (!) 149/91     2. Mixed hyperlipidemia Does try to watch diet and stays very active. BUt no dedicated exercise. Lab Results  Component Value Date   CHOL 105 03/14/2022   HDL 38 (L) 03/14/2022   LDLCALC 50 03/14/2022   TRIG 86 03/14/2022   CHOLHDL 2.8 03/14/2022   The ASCVD Risk score (Arnett DK, et al., 2019) failed to calculate for the following reasons:   The valid total cholesterol range is 130 to 320 mg/dL   3. Recurrent major depressive disorder, in full remission ([place]) Is on wellbutrin daily and is doing well.    09/16/2022    9:39 AM 03/14/2022    9:02 AM 09/11/2021    9:23 AM  Depression screen PHQ 2/9  Decreased Interest 0 0 0  Down, Depressed, Hopeless 0 0 0  PHQ - 2 Score 0 0 0  Altered sleeping 0 0 0  Tired, decreased energy 1 0 0  Change in appetite 2 1 0  Feeling bad or failure about yourself  0 0 0  Trouble concentrating 0 0 0  Moving slowly or fidgety/restless 0 0 0  Suicidal thoughts 0 0 0  PHQ-9 Score 3 1 0  Difficult doing work/chores Not difficult at all Not difficult at all Not difficult at all      09/16/2022    9:39 AM 03/14/2022    9:02 AM 09/11/2021    9:23 AM 02/16/2020    8:41 AM  GAD 7 : Generalized Anxiety Score  Nervous, Anxious, on Edge 0 0 0 0  Control/stop worrying 0 0 0 0  Worry too much - different things 1 0 0 0  Trouble relaxing 0 0 0 0  Restless 0 0 0  0  Easily annoyed or irritable 0 0 0 0  Afraid - awful might happen 0 0 0 0  Total GAD 7 Score 1 0 0 0  Anxiety Difficulty Not difficult at all Not difficult at all Not difficult at all Not difficult at all     4. Morbid obesity ([place]) Has been on ozempic and has lost weight overall. He has stopped the shots because makes him feel bad Wt Readings from Last 3 Encounters:  09/16/22 (!) 303 lb (137.4 kg)  03/14/22 293 lb (132.9 kg)  09/11/21 298 lb (135.2 kg)   BMI Readings from Last 3 Encounters:  09/16/22 41.09 kg/m  03/14/22 39.74 kg/m  09/11/21 40.42 kg/m      New complaints: None today  Allergies  Allergen Reactions   Penicillins Shortness Of Breath   Outpatient Encounter Medications as of 09/16/2022  Medication Sig   amLODipine-benazepril (LOTREL) 5-20 MG capsule Take 1 capsule by mouth 2 (two) times daily.   atorvastatin (LIPITOR) 80 MG tablet Take 1 tablet (80 mg total) by mouth daily at 6 PM. (NEEDS TO BE SEEN  BEFORE NEXT REFILL)   buPROPion (WELLBUTRIN XL) 300 MG 24 hr tablet Take 1 tablet (300 mg total) by mouth daily. (NEEDS TO BE SEEN BEFORE NEXT REFILL)   doxycycline (VIBRA-TABS) 100 MG tablet Take 1 tablet (100 mg total) by mouth 2 (two) times daily. 1 po bid   pantoprazole (PROTONIX) 40 MG tablet Take 1 tablet (40 mg total) by mouth daily.   predniSONE (STERAPRED UNI-PAK 21 TAB) 10 MG (21) TBPK tablet As directed x 6 days   Semaglutide, 2 MG/DOSE, 8 MG/3ML SOPN Inject 2 mg as directed once a week.   No facility-administered encounter medications on file as of 09/16/2022.    Past Surgical History:  Procedure Laterality Date   Removed MRSA infection in scrotum  [id number]    Family History  Problem Relation Age of Onset   Hypertension Father       Controlled substance contract: n/a     Review of Systems  Constitutional:  Negative for diaphoresis.  Eyes:  Negative for pain.  Respiratory:  Negative for shortness of breath.   Cardiovascular:   Negative for chest pain, palpitations and leg swelling.  Gastrointestinal:  Negative for abdominal pain.  Endocrine: Negative for polydipsia.  Skin:  Negative for rash.  Neurological:  Negative for dizziness, weakness and headaches.  Hematological:  Does not bruise/bleed easily.  All other systems reviewed and are negative.      Objective:   Physical Exam Vitals and nursing note reviewed.  Constitutional:      Appearance: Normal appearance. He is well-developed.  HENT:     Head: Normocephalic.     Nose: Nose normal.     Mouth/Throat:     Mouth: Mucous membranes are moist.     Pharynx: Oropharynx is clear.  Eyes:     Pupils: Pupils are equal, round, and reactive to light.  Neck:     Thyroid: No thyroid mass or thyromegaly.     Vascular: No carotid bruit or JVD.     Trachea: Phonation normal.  Cardiovascular:     Rate and Rhythm: Normal rate and regular rhythm.  Pulmonary:     Effort: Pulmonary effort is normal. No respiratory distress.     Breath sounds: Normal breath sounds.  Abdominal:     General: Bowel sounds are normal.     Palpations: Abdomen is soft.     Tenderness: There is no abdominal tenderness.  Musculoskeletal:        General: Normal range of motion.     Cervical back: Normal range of motion and neck supple.  Lymphadenopathy:     Cervical: No cervical adenopathy.  Skin:    General: Skin is warm and dry.  Neurological:     Mental Status: He is alert and oriented to person, place, and time.  Psychiatric:        Behavior: Behavior normal.        Thought Content: Thought content normal.        Judgment: Judgment normal.    BP 126/73   Pulse 78   Temp 97.9 F (36.6 C) (Temporal)   Resp 20   Ht 6' (1.829 m)   Wt (!) 303 lb (137.4 kg)   SpO2 96%   BMI 41.09 kg/m         Assessment & Plan:  [name] comes in today with chief complaint of Medical Management of Chronic Issues   Diagnosis and orders addressed:  1. Essential  hypertension Low sodium diet - amLODipine-benazepril (LOTREL) 5-20  MG capsule; Take 1 capsule by mouth 2 (two) times daily.  Dispense: 180 capsule; Refill: 1 - CBC with Differential/Platelet - CMP14+EGFR  2. Mixed hyperlipidemia Low fat diet - atorvastatin (LIPITOR) 80 MG tablet; Take 1 tablet (80 mg total) by mouth daily at 6 PM. (NEEDS TO BE SEEN BEFORE NEXT REFILL)  Dispense: 90 tablet; Refill: 1 - Lipid panel  3. Recurrent major depressive disorder, in full remission ([place]) Stress management - buPROPion (WELLBUTRIN XL) 300 MG 24 hr tablet; Take 1 tablet (300 mg total) by mouth daily. (NEEDS TO BE SEEN BEFORE NEXT REFILL)  Dispense: 90 tablet; Refill: 1  4. Morbid obesity ([place]) Discussed diet and exercise for person with BMI >25 Will recheck weight in 3-6 months   5. Gastroesophageal reflux disease without esophagitis Avoid spicy foods Do not eat 2 hours prior to bedtime  - pantoprazole (PROTONIX) 40 MG tablet; Take 1 tablet (40 mg total) by mouth daily.  Dispense: 90 tablet; Refill: 1   Labs pending Health Maintenance reviewed Diet and exercise encouraged  Follow up plan: 6 months   Mary-Margaret Hassell Done, FNP

## 2022-09-16 NOTE — Patient Instructions (Signed)
Exercising to Stay Healthy To become healthy and stay healthy, it is recommended that you do moderate-intensity and vigorous-intensity exercise. You can tell that you are exercising at a moderate intensity if your heart starts beating faster and you start breathing faster but can still hold a conversation. You can tell that you are exercising at a vigorous intensity if you are breathing much harder and faster and cannot hold a conversation while exercising. How can exercise benefit me? Exercising regularly is important. It has many health benefits, such as: Improving overall fitness, flexibility, and endurance. Increasing bone density. Helping with weight control. Decreasing body fat. Increasing muscle strength and endurance. Reducing stress and tension, anxiety, depression, or anger. Improving overall health. What guidelines should I follow while exercising? Before you start a new exercise program, talk with your health care provider. Do not exercise so much that you hurt yourself, feel dizzy, or get very short of breath. Wear comfortable clothes and wear shoes with good support. Drink plenty of water while you exercise to prevent dehydration or heat stroke. Work out until your breathing and your heartbeat get faster (moderate intensity). How often should I exercise? Choose an activity that you enjoy, and set realistic goals. Your health care provider can help you make an activity plan that is individually designed and works best for you. Exercise regularly as told by your health care provider. This may include: Doing strength training two times a week, such as: Lifting weights. Using resistance bands. Push-ups. Sit-ups. Yoga. Doing a certain intensity of exercise for a given amount of time. Choose from these options: A total of 150 minutes of moderate-intensity exercise every week. A total of 75 minutes of vigorous-intensity exercise every week. A mix of moderate-intensity and  vigorous-intensity exercise every week. Children, pregnant women, people who have not exercised regularly, people who are overweight, and older adults may need to talk with a health care provider about what activities are safe to perform. If you have a medical condition, be sure to talk with your health care provider before you start a new exercise program. What are some exercise ideas? Moderate-intensity exercise ideas include: Walking 1 mile (1.6 km) in about 15 minutes. Biking. Hiking. Golfing. Dancing. Water aerobics. Vigorous-intensity exercise ideas include: Walking 4.5 miles (7.2 km) or more in about 1 hour. Jogging or running 5 miles (8 km) in about 1 hour. Biking 10 miles (16.1 km) or more in about 1 hour. Lap swimming. Roller-skating or in-line skating. Cross-country skiing. Vigorous competitive sports, such as football, basketball, and soccer. Jumping rope. Aerobic dancing. What are some everyday activities that can help me get exercise? Yard work, such as: Pushing a lawn mower. Raking and bagging leaves. Washing your car. Pushing a stroller. Shoveling snow. Gardening. Washing windows or floors. How can I be more active in my day-to-day activities? Use stairs instead of an elevator. Take a walk during your lunch break. If you drive, park your car farther away from your work or school. If you take public transportation, get off one stop early and walk the rest of the way. Stand up or walk around during all of your indoor phone calls. Get up, stretch, and walk around every 30 minutes throughout the day. Enjoy exercise with a friend. Support to continue exercising will help you keep a regular routine of activity. Where to find more information You can find more information about exercising to stay healthy from: U.S. Department of Health and Human Services: www.hhs.gov Centers for Disease Control and Prevention (  CDC): www.cdc.gov Summary Exercising regularly is  important. It will improve your overall fitness, flexibility, and endurance. Regular exercise will also improve your overall health. It can help you control your weight, reduce stress, and improve your bone density. Do not exercise so much that you hurt yourself, feel dizzy, or get very short of breath. Before you start a new exercise program, talk with your health care provider. This information is not intended to replace advice given to you by your health care provider. Make sure you discuss any questions you have with your health care provider. Document Revised: 01/12/2021 Document Reviewed: 01/12/2021 Elsevier Patient Education  2023 Elsevier Inc.  

## 2023-03-20 ENCOUNTER — Encounter: Payer: Self-pay | Admitting: Nurse Practitioner

## 2023-03-20 ENCOUNTER — Ambulatory Visit (INDEPENDENT_AMBULATORY_CARE_PROVIDER_SITE_OTHER): Payer: No Typology Code available for payment source | Admitting: Nurse Practitioner

## 2023-03-20 VITALS — BP 124/66 | HR 88 | Temp 97.9°F | Resp 20 | Ht 72.0 in | Wt 299.0 lb

## 2023-03-20 DIAGNOSIS — I10 Essential (primary) hypertension: Secondary | ICD-10-CM | POA: Diagnosis not present

## 2023-03-20 DIAGNOSIS — E782 Mixed hyperlipidemia: Secondary | ICD-10-CM | POA: Diagnosis not present

## 2023-03-20 DIAGNOSIS — F3342 Major depressive disorder, recurrent, in full remission: Secondary | ICD-10-CM

## 2023-03-20 DIAGNOSIS — K219 Gastro-esophageal reflux disease without esophagitis: Secondary | ICD-10-CM

## 2023-03-20 LAB — CBC WITH DIFFERENTIAL/PLATELET
Basophils Absolute: 0.1 10*3/uL (ref 0.0–0.2)
Basos: 1 %
EOS (ABSOLUTE): 0.3 10*3/uL (ref 0.0–0.4)
Eos: 3 %
Hematocrit: 49.3 % (ref 37.5–51.0)
Hemoglobin: 15.8 g/dL (ref 13.0–17.7)
Immature Grans (Abs): 0 10*3/uL (ref 0.0–0.1)
Immature Granulocytes: 0 %
Lymphocytes Absolute: 2.1 10*3/uL (ref 0.7–3.1)
Lymphs: 26 %
MCH: 28.1 pg (ref 26.6–33.0)
MCHC: 32 g/dL (ref 31.5–35.7)
MCV: 88 fL (ref 79–97)
Monocytes Absolute: 0.9 10*3/uL (ref 0.1–0.9)
Monocytes: 11 %
Neutrophils Absolute: 4.7 10*3/uL (ref 1.4–7.0)
Neutrophils: 59 %
Platelets: 232 10*3/uL (ref 150–450)
RBC: 5.62 x10E6/uL (ref 4.14–5.80)
RDW: 13.2 % (ref 11.6–15.4)
WBC: 7.9 10*3/uL (ref 3.4–10.8)

## 2023-03-20 LAB — CMP14+EGFR
ALT: 28 IU/L (ref 0–44)
AST: 26 IU/L (ref 0–40)
Albumin: 4 g/dL (ref 3.9–4.9)
Alkaline Phosphatase: 75 IU/L (ref 44–121)
BUN/Creatinine Ratio: 14 (ref 10–24)
BUN: 15 mg/dL (ref 8–27)
Bilirubin Total: 0.9 mg/dL (ref 0.0–1.2)
CO2: 20 mmol/L (ref 20–29)
Calcium: 8.9 mg/dL (ref 8.6–10.2)
Chloride: 105 mmol/L (ref 96–106)
Creatinine, Ser: 1.09 mg/dL (ref 0.76–1.27)
Globulin, Total: 2.8 g/dL (ref 1.5–4.5)
Glucose: 94 mg/dL (ref 70–99)
Potassium: 4.7 mmol/L (ref 3.5–5.2)
Sodium: 137 mmol/L (ref 134–144)
Total Protein: 6.8 g/dL (ref 6.0–8.5)
eGFR: 77 mL/min/{1.73_m2} (ref >59)

## 2023-03-20 LAB — LIPID PANEL
Chol/HDL Ratio: 2.7 ratio (ref 0.0–5.0)
Cholesterol, Total: 116 mg/dL (ref 100–199)
HDL: 43 mg/dL (ref >39)
LDL Chol Calc (NIH): 57 mg/dL (ref 0–99)
Triglycerides: 77 mg/dL (ref 0–149)
VLDL Cholesterol Cal: 16 mg/dL (ref 5–40)

## 2023-03-20 MED ORDER — PANTOPRAZOLE SODIUM 40 MG PO TBEC: 40.0000 mg | DELAYED_RELEASE_TABLET | Freq: Every day | ORAL | 1 refills | Status: AC

## 2023-03-20 MED ORDER — AMLODIPINE BESY-BENAZEPRIL HCL 5-20 MG PO CAPS: 1.0000 | ORAL_CAPSULE | Freq: Two times a day (BID) | ORAL | 1 refills | Status: DC

## 2023-03-20 MED ORDER — ATORVASTATIN CALCIUM 80 MG PO TABS: 80.0000 mg | ORAL_TABLET | Freq: Every day | ORAL | 1 refills | Status: DC

## 2023-03-20 NOTE — Progress Notes (Signed)
Subjective:    Patient ID: Antonio Weber, male    DOB: [birth year], 62 y.o.   MRN: [id number]   Chief Complaint: medical management of chronic issues     HPI:  Antonio Weber is a 62 y.o. who identifies as a male who was assigned male at birth.   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 Essential hypertension No c/o chest pain, sob or headache. Does check blood pressure occasionally and is usually in normal range BP Readings from Last 3 Encounters:  09/16/22 126/73  03/14/22 132/80  09/11/21 136/82     2. Mixed hyperlipidemia Does not watch diety very closely. Does try to stay active Lab Results  Component Value Date   CHOL 125 09/16/2022   HDL 44 09/16/2022   LDLCALC 60 09/16/2022   TRIG 116 09/16/2022   CHOLHDL 2.8 09/16/2022     3. Recurrent major depressive disorder, in full remission (HCC) Is on wellbutrin and is doing well Is on wellbutrin and is doing well.    03/20/2023    8:30 AM 09/16/2022    9:39 AM 03/14/2022    9:02 AM  Depression screen PHQ 2/9  Decreased Interest 0 0 0  Down, Depressed, Hopeless 0 0 0  PHQ - 2 Score 0 0 0  Altered sleeping 0 0 0  Tired, decreased energy 0 1 0  Change in appetite 1 2 1   Feeling bad or failure about yourself  0 0 0  Trouble concentrating 0 0 0  Moving slowly or fidgety/restless 0 0 0  Suicidal thoughts 0 0 0  PHQ-9 Score 1 3 1   Difficult doing work/chores Not difficult at all Not difficult at all Not difficult at all     4. Morbid obesity (HCC) Is back on wegovy shots. He takes a break every now and then. Weight is down 4lbs Wt Readings from Last 3 Encounters:  03/20/23 299 lb (135.6 kg)  09/16/22 (!) 303 lb (137.4 kg)  03/14/22 293 lb (132.9 kg)   BMI Readings from Last 3 Encounters:  03/20/23 40.55 kg/m  09/16/22 41.09 kg/m  03/14/22 39.74 kg/m      New complaints: None  today  Allergies  Allergen Reactions   Penicillins Shortness Of Breath   Outpatient Encounter Medications as of 03/20/2023  Medication Sig   amLODipine-benazepril (LOTREL) 5-20 MG capsule Take 1 capsule by mouth 2 (two) times daily.   atorvastatin (LIPITOR) 80 MG tablet Take 1 tablet (80 mg total) by mouth daily at 6 PM. (NEEDS TO BE SEEN BEFORE NEXT REFILL)   buPROPion (WELLBUTRIN XL) 300 MG 24 hr tablet Take 1 tablet (300 mg total) by mouth daily. (NEEDS TO BE SEEN BEFORE NEXT REFILL)   pantoprazole (PROTONIX) 40 MG tablet Take 1 tablet (40 mg total) by mouth daily.   Semaglutide, 2 MG/DOSE, 8 MG/3ML SOPN Inject 2 mg as directed once a week. (Patient not taking: Reported on 09/16/2022)   No facility-administered encounter medications on file as of 03/20/2023.    Past Surgical History:  Procedure Laterality Date   Removed MRSA infection in scrotum  [id number]    Family History  Problem Relation Age of Onset   Hypertension Father       Controlled substance contract: n/a     Review of Systems  Constitutional:  Negative for diaphoresis.  Eyes:  Negative for pain.  Respiratory:  Negative for shortness  of breath.   Cardiovascular:  Negative for chest pain, palpitations and leg swelling.  Gastrointestinal:  Negative for abdominal pain.  Endocrine: Negative for polydipsia.  Skin:  Negative for rash.  Neurological:  Negative for dizziness, weakness and headaches.  Hematological:  Does not bruise/bleed easily.  All other systems reviewed and are negative.      Objective:   Physical Exam Vitals and nursing note reviewed.  Constitutional:      Appearance: Normal appearance. He is well-developed.  HENT:     Head: Normocephalic.     Nose: Nose normal.     Mouth/Throat:     Mouth: Mucous membranes are moist.     Pharynx: Oropharynx is clear.  Eyes:     Pupils: Pupils are equal, round, and reactive to light.  Neck:     Thyroid: No thyroid mass or thyromegaly.      Vascular: No carotid bruit or JVD.     Trachea: Phonation normal.  Cardiovascular:     Rate and Rhythm: Normal rate and regular rhythm.  Pulmonary:     Effort: Pulmonary effort is normal. No respiratory distress.     Breath sounds: Normal breath sounds.  Abdominal:     General: Bowel sounds are normal.     Palpations: Abdomen is soft.     Tenderness: There is no abdominal tenderness.  Musculoskeletal:        General: Normal range of motion.     Cervical back: Normal range of motion and neck supple.  Lymphadenopathy:     Cervical: No cervical adenopathy.  Skin:    General: Skin is warm and dry.  Neurological:     Mental Status: He is alert and oriented to person, place, and time.  Psychiatric:        Behavior: Behavior normal.        Thought Content: Thought content normal.        Judgment: Judgment normal.     BP 124/66   Pulse 88   Temp 97.9 F (36.6 C) (Temporal)   Resp 20   Ht 6' (1.829 m)   Wt 299 lb (135.6 kg)   SpO2 98%   BMI 40.55 kg/m        Assessment & Plan:   Taitum Wengerd comes in today with chief complaint of Medical Management of Chronic Issues   Diagnosis and orders addressed:  1. Essential hypertension Low sodium diet - amLODipine-benazepril (LOTREL) 5-20 MG capsule; Take 1 capsule by mouth 2 (two) times daily.  Dispense: 180 capsule; Refill: 1 - CBC with Differential/Platelet - CMP14+EGFR  2. Mixed hyperlipidemia Low fat diet - atorvastatin (LIPITOR) 80 MG tablet; Take 1 tablet (80 mg total) by mouth daily at 6 PM. (NEEDS TO BE SEEN BEFORE NEXT REFILL)  Dispense: 90 tablet; Refill: 1 - Lipid panel  3. Recurrent major depressive disorder, in full remission (HCC) Stress management  4. Morbid obesity (HCC) Discussed diet and exercise for person with BMI >25 Will recheck weight in 3-6 months   5. Gastroesophageal reflux disease without esophagitis Avoid spicy foods Do not eat 2 hours prior to bedtime  - pantoprazole (PROTONIX) 40 MG  tablet; Take 1 tablet (40 mg total) by mouth daily.  Dispense: 90 tablet; Refill: 1   Labs pending Health Maintenance reviewed Diet and exercise encouraged  Follow up plan: 6 months   Mary-Margaret Daphine Deutscher, FNP

## 2023-03-31 ENCOUNTER — Other Ambulatory Visit: Payer: Self-pay | Admitting: *Deleted

## 2023-03-31 DIAGNOSIS — F3342 Major depressive disorder, recurrent, in full remission: Secondary | ICD-10-CM

## 2023-03-31 MED ORDER — BUPROPION HCL ER (XL) 300 MG PO TB24: 300.0000 mg | ORAL_TABLET | Freq: Every day | ORAL | 1 refills | Status: DC

## 2023-09-19 ENCOUNTER — Ambulatory Visit: Payer: No Typology Code available for payment source | Admitting: Nurse Practitioner

## 2023-10-02 ENCOUNTER — Other Ambulatory Visit: Payer: Self-pay | Admitting: Nurse Practitioner

## 2023-10-02 DIAGNOSIS — F3342 Major depressive disorder, recurrent, in full remission: Secondary | ICD-10-CM

## 2023-10-02 MED ORDER — BUPROPION HCL ER (XL) 300 MG PO TB24: 300.0000 mg | ORAL_TABLET | Freq: Every day | ORAL | 1 refills | Status: DC

## 2023-11-03 LAB — LAB REPORT - SCANNED: EGFR: 63

## 2023-11-10 ENCOUNTER — Encounter: Payer: Self-pay | Admitting: Nurse Practitioner

## 2023-11-10 ENCOUNTER — Ambulatory Visit: Payer: BC Managed Care – PPO | Admitting: Nurse Practitioner

## 2023-11-10 VITALS — BP 107/61 | HR 75 | Temp 97.5°F | Ht 72.0 in | Wt 284.0 lb

## 2023-11-10 DIAGNOSIS — Z8709 Personal history of other diseases of the respiratory system: Secondary | ICD-10-CM

## 2023-11-10 DIAGNOSIS — Z23 Encounter for immunization: Secondary | ICD-10-CM

## 2023-11-10 DIAGNOSIS — I10 Essential (primary) hypertension: Secondary | ICD-10-CM

## 2023-11-10 DIAGNOSIS — F3342 Major depressive disorder, recurrent, in full remission: Secondary | ICD-10-CM

## 2023-11-10 DIAGNOSIS — E782 Mixed hyperlipidemia: Secondary | ICD-10-CM

## 2023-11-10 MED ORDER — BUPROPION HCL ER (XL) 300 MG PO TB24: 300.0000 mg | ORAL_TABLET | Freq: Every day | ORAL | 1 refills | Status: DC

## 2023-11-10 NOTE — Progress Notes (Signed)
 Subjective:    Patient ID: Antonio Weber, male    DOB: Aug 15, 1961, 63 y.o.   MRN: [id number]   Chief Complaint: medical management of chronic issues     HPI:  Antonio Weber is a 63 y.o. who identifies as a male who was assigned male at birth.   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 Essential hypertension No c/o chest pain, or headache. Does check blood pressure occasionally and is usually in normal range BP Readings from Last 3 Encounters:  03/20/23 124/66  09/16/22 126/73  03/14/22 132/80     2. Mixed hyperlipidemia Does not watch diety very closely. Does try to stay active Lab Results  Component Value Date   CHOL 116 03/20/2023   HDL 43 03/20/2023   LDLCALC 57 03/20/2023   TRIG 77 03/20/2023   CHOLHDL 2.7 03/20/2023     3. Recurrent major depressive disorder, in full remission (HCC) Is on wellbutrin  and is doing well     03/20/2023    8:30 AM 09/16/2022    9:39 AM 03/14/2022    9:02 AM  Depression screen PHQ 2/9  Decreased Interest 0 0 0  Down, Depressed, Hopeless 0 0 0  PHQ - 2 Score 0 0 0  Altered sleeping 0 0 0  Tired, decreased energy 0 1 0  Change in appetite 1 2 1   Feeling bad or failure about yourself  0 0 0  Trouble concentrating 0 0 0  Moving slowly or fidgety/restless 0 0 0  Suicidal thoughts 0 0 0  PHQ-9 Score 1 3 1   Difficult doing work/chores Not difficult at all Not difficult at all Not difficult at all     4. Morbid obesity (HCC) Is back on wegovy  shots. He takes a break every now and then. Weight is down 15lbs  Wt Readings from Last 3 Encounters:  11/10/23 284 lb (128.8 kg)  03/20/23 299 lb (135.6 kg)  09/16/22 (!) 303 lb (137.4 kg)   BMI Readings from Last 3 Encounters:  11/10/23 38.52 kg/m  03/20/23 40.55 kg/m  09/16/22 41.09 kg/m       New complaints: 2 weeks ago patinet was taken to emergency room with  severe SOB. He was dx with pulmonary edema. Had emergency cardiac  cath and was dx with severe aortic valve stenosis. He is being treated with lasix and has appointment at [facility] center on November 14, 2023. His breathing has improve. He was told to do very limited activity until visit at [facility].  Allergies  Allergen Reactions   Penicillins Shortness Of Breath   Outpatient Encounter Medications as of 11/10/2023  Medication Sig   amLODipine -benazepril  (LOTREL) 5-20 MG capsule Take 1 capsule by mouth 2 (two) times daily.   atorvastatin  (LIPITOR) 80 MG tablet Take 1 tablet (80 mg total) by mouth daily at 6 PM. (NEEDS TO BE SEEN BEFORE NEXT REFILL)   buPROPion  (WELLBUTRIN  XL) 300 MG 24 hr tablet Take 1 tablet (300 mg total) by mouth daily.   pantoprazole  (PROTONIX ) 40 MG tablet Take 1 tablet (40 mg total) by mouth daily.   WEGOVY  0.25 MG/0.5ML SOAJ Inject 0.5 mg weekly   No facility-administered encounter medications on file as of 11/10/2023.    Past Surgical History:  Procedure Laterality Date   Removed MRSA infection in scrotum  [id number]    Family History  Problem Relation Age of Onset  Hypertension Father       Controlled substance contract: n/a     Review of Systems  Constitutional:  Negative for diaphoresis.  Eyes:  Negative for pain.  Respiratory:  Negative for shortness of breath.   Cardiovascular:  Negative for chest pain, palpitations and leg swelling.  Gastrointestinal:  Negative for abdominal pain.  Endocrine: Negative for polydipsia.  Skin:  Negative for rash.  Neurological:  Negative for dizziness, weakness and headaches.  Hematological:  Does not bruise/bleed easily.  All other systems reviewed and are negative.      Objective:   Physical Exam Vitals and nursing note reviewed.  Constitutional:      Appearance: Normal appearance. He is well-developed.  HENT:     Head: Normocephalic.     Nose: Nose normal.     Mouth/Throat:     Mouth: Mucous  membranes are moist.     Pharynx: Oropharynx is clear.  Eyes:     Pupils: Pupils are equal, round, and reactive to light.  Neck:     Thyroid : No thyroid  mass or thyromegaly.     Vascular: No carotid bruit or JVD.     Trachea: Phonation normal.  Cardiovascular:     Rate and Rhythm: Normal rate and regular rhythm.  Pulmonary:     Effort: Pulmonary effort is normal. No respiratory distress.     Breath sounds: Normal breath sounds.  Abdominal:     General: Bowel sounds are normal.     Palpations: Abdomen is soft.     Tenderness: There is no abdominal tenderness.  Musculoskeletal:        General: Normal range of motion.     Cervical back: Normal range of motion and neck supple.  Lymphadenopathy:     Cervical: No cervical adenopathy.  Skin:    General: Skin is warm and dry.  Neurological:     Mental Status: He is alert and oriented to person, place, and time.  Psychiatric:        Behavior: Behavior normal.        Thought Content: Thought content normal.        Judgment: Judgment normal.     There were no vitals taken for this visit.       Assessment & Plan:   Antonio Weber comes in today with chief complaint of No chief complaint on file.   Diagnosis and orders addressed:  1. Essential hypertension Low sodium diet - amLODipine -benazepril  (LOTREL) 5-20 MG capsule; Take 1 capsule by mouth 2 (two) times daily.  Dispense: 180 capsule; Refill: 1 - CBC with Differential/Platelet - CMP14+EGFR  2. Mixed hyperlipidemia Low fat diet - atorvastatin  (LIPITOR) 80 MG tablet; Take 1 tablet (80 mg total) by mouth daily at 6 PM. (NEEDS TO BE SEEN BEFORE NEXT REFILL)  Dispense: 90 tablet; Refill: 1 - Lipid panel  3. Recurrent major depressive disorder, in full remission (HCC) Stress management  4. Morbid obesity (HCC) Discussed diet and exercise for person with BMI >25 Will recheck weight in 3-6 months   5. Gastroesophageal reflux disease without esophagitis Avoid spicy  foods Do not eat 2 hours prior to bedtime  - pantoprazole  (PROTONIX ) 40 MG tablet; Take 1 tablet (40 mg total) by mouth daily.  Dispense: 90 tablet; Refill: 1   Labs pending Health Maintenance reviewed Diet and exercise encouraged  Follow up plan: 6 months   Mary-Margaret Gaylyn Keas, FNP

## 2023-11-10 NOTE — Patient Instructions (Signed)
 Pulmonary Edema  Pulmonary edema is a condition that happens when fluid collects in the air sacs of the lungs. This makes it hard to breathe. This condition can be caused by a heart problem or by something else, such as a lung injury. Pulmonary edema is an emergency. It needs to be treated right away. What are the causes? This condition is often caused by heart failure. Heart failure may be caused by: Coronary artery disease. High blood pressure. Infection of the heart from a virus. Leaky or stiff heart valves. A heartbeat that's not regular. Fluid buildup caused by kidney problems. Other causes of pulmonary edema include: Infection in the lungs, blood, or other parts of the body. A very bad injury to the chest. Lung injury from heat or breathing in smoke or poisonous gas. Breathing in vomit or water. Certain medicines. High altitude. What are the signs or symptoms? Symptoms of this condition include: Shortness of breath. Coughing with frothy or bloody mucus. Making high-pitched whistling sounds when you breathe, most often when you breathe out. Feeling like you can't get enough air. Shallow and fast breathing. Skin that's cool and damp, and looks pale or blue. Pulmonary edema prevents the lungs from moving oxygen into the blood, which can affect other parts of the body, such as the brain and kidneys. How is this diagnosed? This condition is diagnosed based on: Your medical history. A physical exam. Your symptoms. You may also have tests, including: Chest X-ray. Chest CT scan. Blood tests, including checking the amount of oxygen in your blood. Electrocardiogram. This measures the electrical signals of your heart. Echocardiogram. This uses an ultrasound to check the health of your heart. Coronary angiogram. This uses an X-ray to see your heart's blood vessels. How is this treated? Treatment depends on the cause. It may include: Getting oxygen through tubes in your nose or  through a face mask. In very bad cases, you may need a breathing tube hooked up to a breathing machine called a ventilator. Medicines to: Help the body get rid of extra water. Help the heart pump blood well. Prevent or destroy blood clots. If poor heart function is the cause, treatment may also include: Heart procedures. A pacemaker. If an infection is the cause, treatment may include antibiotics. Follow these instructions at home: Medicines Take your medicines only as told by your provider. If you were given antibiotics, take them as told. Do not stop taking them even if you start to feel better. Ask your provider to help you write a plan with information about each medicine you take. This should include: Why you take the medicine. Possible side effects. Best time of day to take it. Foods to take with it, or foods to avoid when taking it. When to call your provider. Make a list of medicine, vitamins, or herbs you take. Keep the list with you at all times. Show the list to your provider at each visit and before starting a new medicine. Keep the list up to date. Lifestyle  Exercise as told. You may need to: [place] your activities. This will help you avoid shortness of breath or chest pain. Rest for at least 1 hour before and after meals. Ask about cardiac rehab. Eat heart-healthy foods that are low in salt, saturated fat, and cholesterol. Your provider may suggest foods that are high in fiber, such as: Fresh fruits and vegetables. Whole grains. Beans. Do not smoke, vape, or use nicotine or tobacco. General instructions Stay at a healthy weight. Keep  a log of your weight: Write down your hospital or clinic weight. When you get home, compare it to your scale and write down your weight. Weigh yourself in the morning each day, and write down your weight. You should weigh yourself every morning after you pee but before you eat breakfast. Wear the same amount of clothes each time you  weigh yourself. Share your weight log with your provider. This can help your provider see if your body is holding extra fluid. Tell your provider right away if you gain weight quickly. Your medicines may need to be adjusted. Check your blood pressure as often as told by your provider. Write down your blood pressure readings. Bring them with you for your clinic visits. Think about doing therapy or joining a support group. Keep all follow-up visits. Your provider will want to check on your condition. Contact a health care provider if: You have a dry, hacking cough. You're gaining weight slowly. Your shortness of breath gets worse with activity. You have more swelling in your hands, feet, ankles, or belly. You have difficulty sleeping because it's hard to breathe. Your blood pressure is higher than 180/120. Get help right away if: You gain weight quickly. This means gaining more than 2-3 lb (1-1.4 kg) in 24 hours or more than 5 lb (2.3 kg) in a week. You have very bad chest pain, especially if the pain is crushing or pressure-like and spreads to the arms, back, neck, or jaw. You have unusual sweating. Your skin turns blue or pale. Your shortness of breath happens without any activity. You have new or worse dizziness, blurred vision, headache, or unsteadiness. You're coughing often or cough up bloody mucus. You have a racing heartbeat. You have anxiety or a feeling that you cannot get enough air. You can't breathe when lying flat. Your blood pressure is higher than 180/120 and you have any of the above symptoms. These symptoms may be an emergency. Call 911 right away. Do not wait to see if the symptoms will go away. Do not drive yourself to the hospital. This information is not intended to replace advice given to you by your health care provider. Make sure you discuss any questions you have with your health care provider. Document Revised: 07/29/2023 Document Reviewed: 01/25/2023 Elsevier  Patient Education  2024 [employer].

## 2023-11-10 NOTE — Addendum Note (Signed)
 Addended by: Delfina Feller on: 11/10/2023 03:25 PM   Modules accepted: Level of Service

## 2023-11-11 LAB — CMP14+EGFR
ALT: 36 [IU]/L (ref 0–44)
AST: 30 [IU]/L (ref 0–40)
Albumin: 3.9 g/dL (ref 3.9–4.9)
Alkaline Phosphatase: 74 [IU]/L (ref 44–121)
BUN/Creatinine Ratio: 17 (ref 10–24)
BUN: 21 mg/dL (ref 8–27)
Bilirubin Total: 0.7 mg/dL (ref 0.0–1.2)
CO2: 22 mmol/L (ref 20–29)
Calcium: 9.1 mg/dL (ref 8.6–10.2)
Chloride: 103 mmol/L (ref 96–106)
Creatinine, Ser: 1.26 mg/dL (ref 0.76–1.27)
Globulin, Total: 2.7 g/dL (ref 1.5–4.5)
Glucose: 102 mg/dL — ABNORMAL HIGH (ref 70–99)
Potassium: 4.6 mmol/L (ref 3.5–5.2)
Sodium: 138 mmol/L (ref 134–144)
Total Protein: 6.6 g/dL (ref 6.0–8.5)
eGFR: 64 mL/min/{1.73_m2} (ref >59)

## 2023-11-11 LAB — BRAIN NATRIURETIC PEPTIDE: BNP: 499.9 pg/mL — ABNORMAL HIGH (ref 0.0–100.0)

## 2023-11-14 ENCOUNTER — Other Ambulatory Visit: Payer: Self-pay | Admitting: Nurse Practitioner

## 2023-11-14 MED ORDER — WEGOVY 0.5 MG/0.5ML ~~LOC~~ SOAJ: 0.5000 mg | SUBCUTANEOUS | 1 refills | Status: DC

## 2023-11-14 MED ORDER — WEGOVY 0.5 MG/0.5ML ~~LOC~~ SOAJ: 0.5000 mg | SUBCUTANEOUS | 1 refills | Status: AC

## 2023-11-18 ENCOUNTER — Telehealth (HOSPITAL_COMMUNITY): Payer: Self-pay | Admitting: Pharmacy Technician

## 2023-11-18 ENCOUNTER — Other Ambulatory Visit: Payer: Self-pay | Admitting: Nurse Practitioner

## 2023-11-18 ENCOUNTER — Other Ambulatory Visit (HOSPITAL_COMMUNITY): Payer: Self-pay

## 2023-11-18 DIAGNOSIS — E782 Mixed hyperlipidemia: Secondary | ICD-10-CM

## 2023-11-18 MED ORDER — ATORVASTATIN CALCIUM 80 MG PO TABS: 80.0000 mg | ORAL_TABLET | Freq: Every day | ORAL | 1 refills | Status: AC

## 2023-11-18 NOTE — Telephone Encounter (Signed)
Pharmacy Patient Advocate Encounter  Received notification from [employer] that Prior Authorization for [facility] 0.5MG /0.5ML AUTO-INJECTORS has been DENIED.  See denial reason below. No denial letter attached in CMM. Will attach denial letter to Media tab once received.   PA #/Case ID/Reference #: Key: [id number]    Pt is dual insured. Additional Insurance coverage thru [employer]. Reached out to [employer] via phone and was advised that  [facility] is excluded from coverage for weight loss. Weight loss preferred products include:

## 2024-02-16 ENCOUNTER — Other Ambulatory Visit: Payer: Self-pay | Admitting: Nurse Practitioner

## 2024-02-16 DIAGNOSIS — F3342 Major depressive disorder, recurrent, in full remission: Secondary | ICD-10-CM

## 2024-02-16 MED ORDER — BUPROPION HCL ER (XL) 300 MG PO TB24: 300.0000 mg | ORAL_TABLET | Freq: Every day | ORAL | 1 refills | Status: AC
